# Patient Record
Sex: Female | Born: 1965 | Race: White | Hispanic: No | Marital: Married | Smoking: Former smoker
Health system: Southern US, Community
[De-identification: ages and names within clinical notes are randomized; demographics above are authoritative.]

## PROBLEM LIST (undated history)

## (undated) DIAGNOSIS — D219 Benign neoplasm of connective and other soft tissue, unspecified: Secondary | ICD-10-CM

## (undated) DIAGNOSIS — T753XXA Motion sickness, initial encounter: Secondary | ICD-10-CM

## (undated) DIAGNOSIS — T8859XA Other complications of anesthesia, initial encounter: Secondary | ICD-10-CM

## (undated) DIAGNOSIS — T4145XA Adverse effect of unspecified anesthetic, initial encounter: Secondary | ICD-10-CM

## (undated) DIAGNOSIS — K219 Gastro-esophageal reflux disease without esophagitis: Secondary | ICD-10-CM

## (undated) DIAGNOSIS — R51 Headache: Secondary | ICD-10-CM

## (undated) DIAGNOSIS — J45909 Unspecified asthma, uncomplicated: Secondary | ICD-10-CM

## (undated) DIAGNOSIS — Q283 Other malformations of cerebral vessels: Secondary | ICD-10-CM

## (undated) DIAGNOSIS — D249 Benign neoplasm of unspecified breast: Secondary | ICD-10-CM

## (undated) DIAGNOSIS — M810 Age-related osteoporosis without current pathological fracture: Secondary | ICD-10-CM

## (undated) DIAGNOSIS — R87619 Unspecified abnormal cytological findings in specimens from cervix uteri: Secondary | ICD-10-CM

## (undated) DIAGNOSIS — F419 Anxiety disorder, unspecified: Secondary | ICD-10-CM

## (undated) DIAGNOSIS — E039 Hypothyroidism, unspecified: Secondary | ICD-10-CM

## (undated) DIAGNOSIS — D649 Anemia, unspecified: Secondary | ICD-10-CM

## (undated) DIAGNOSIS — N809 Endometriosis, unspecified: Secondary | ICD-10-CM

## (undated) DIAGNOSIS — M1990 Unspecified osteoarthritis, unspecified site: Secondary | ICD-10-CM

## (undated) HISTORY — DX: Benign neoplasm of connective and other soft tissue, unspecified: D21.9

## (undated) HISTORY — DX: Anemia, unspecified: D64.9

## (undated) HISTORY — DX: Age-related osteoporosis without current pathological fracture: M81.0

## (undated) HISTORY — DX: Unspecified abnormal cytological findings in specimens from cervix uteri: R87.619

## (undated) HISTORY — DX: Anxiety disorder, unspecified: F41.9

## (undated) HISTORY — DX: Endometriosis, unspecified: N80.9

---

## 1988-02-19 HISTORY — PX: THYROID LOBECTOMY: SHX420

## 1989-02-18 HISTORY — PX: TUBAL LIGATION: SHX77

## 1990-02-18 DIAGNOSIS — R87619 Unspecified abnormal cytological findings in specimens from cervix uteri: Secondary | ICD-10-CM

## 1990-02-18 HISTORY — DX: Unspecified abnormal cytological findings in specimens from cervix uteri: R87.619

## 1992-02-19 HISTORY — PX: CHOLECYSTECTOMY: SHX55

## 2002-02-18 HISTORY — PX: OOPHORECTOMY: SHX86

## 2002-04-13 ENCOUNTER — Ambulatory Visit: Admission: RE | Admit: 2002-04-13 | Discharge: 2002-04-13 | Payer: Self-pay | Admitting: Gynecology

## 2002-04-16 ENCOUNTER — Encounter: Payer: Self-pay | Admitting: Gynecology

## 2002-04-20 ENCOUNTER — Inpatient Hospital Stay (HOSPITAL_COMMUNITY): Admission: RE | Admit: 2002-04-20 | Discharge: 2002-04-21 | Payer: Self-pay | Admitting: Gynecology

## 2002-04-20 ENCOUNTER — Encounter (INDEPENDENT_AMBULATORY_CARE_PROVIDER_SITE_OTHER): Payer: Self-pay

## 2002-04-20 HISTORY — PX: TOTAL ABDOMINAL HYSTERECTOMY: SHX209

## 2003-05-26 ENCOUNTER — Other Ambulatory Visit: Admission: RE | Admit: 2003-05-26 | Discharge: 2003-05-26 | Payer: Self-pay | Admitting: Gynecology

## 2003-06-13 ENCOUNTER — Encounter: Admission: RE | Admit: 2003-06-13 | Discharge: 2003-06-13 | Payer: Self-pay | Admitting: Gynecology

## 2003-07-12 ENCOUNTER — Encounter: Admission: RE | Admit: 2003-07-12 | Discharge: 2003-07-12 | Payer: Self-pay | Admitting: Gynecology

## 2004-05-24 ENCOUNTER — Other Ambulatory Visit: Admission: RE | Admit: 2004-05-24 | Discharge: 2004-05-24 | Payer: Self-pay | Admitting: Gynecology

## 2004-06-14 ENCOUNTER — Encounter: Admission: RE | Admit: 2004-06-14 | Discharge: 2004-06-14 | Payer: Self-pay | Admitting: Gynecology

## 2004-06-26 ENCOUNTER — Encounter: Admission: RE | Admit: 2004-06-26 | Discharge: 2004-06-26 | Payer: Self-pay | Admitting: Gynecology

## 2005-05-28 ENCOUNTER — Other Ambulatory Visit: Admission: RE | Admit: 2005-05-28 | Discharge: 2005-05-28 | Payer: Self-pay | Admitting: Gynecology

## 2005-06-17 ENCOUNTER — Encounter: Admission: RE | Admit: 2005-06-17 | Discharge: 2005-06-17 | Payer: Self-pay | Admitting: Gynecology

## 2006-06-24 ENCOUNTER — Encounter: Admission: RE | Admit: 2006-06-24 | Discharge: 2006-06-24 | Payer: Self-pay | Admitting: Gynecology

## 2006-09-19 ENCOUNTER — Encounter: Admission: RE | Admit: 2006-09-19 | Discharge: 2006-09-19 | Payer: Self-pay | Admitting: Surgery

## 2007-08-05 ENCOUNTER — Encounter: Admission: RE | Admit: 2007-08-05 | Discharge: 2007-08-05 | Payer: Self-pay | Admitting: Family Medicine

## 2008-03-17 ENCOUNTER — Other Ambulatory Visit: Admission: RE | Admit: 2008-03-17 | Discharge: 2008-03-17 | Payer: Self-pay | Admitting: Gynecology

## 2008-07-13 ENCOUNTER — Emergency Department (HOSPITAL_BASED_OUTPATIENT_CLINIC_OR_DEPARTMENT_OTHER): Admission: EM | Admit: 2008-07-13 | Discharge: 2008-07-13 | Payer: Self-pay | Admitting: Emergency Medicine

## 2008-08-29 ENCOUNTER — Encounter: Admission: RE | Admit: 2008-08-29 | Discharge: 2008-08-29 | Payer: Self-pay | Admitting: Gynecology

## 2009-02-18 HISTORY — PX: EVALUATION UNDER ANESTHESIA WITH ANAL FISTULECTOMY: SHX5621

## 2009-09-05 ENCOUNTER — Encounter: Admission: RE | Admit: 2009-09-05 | Discharge: 2009-09-05 | Payer: Self-pay | Admitting: Obstetrics and Gynecology

## 2009-09-08 ENCOUNTER — Encounter: Admission: RE | Admit: 2009-09-08 | Discharge: 2009-09-08 | Payer: Self-pay | Admitting: Obstetrics and Gynecology

## 2010-03-11 ENCOUNTER — Encounter: Payer: Self-pay | Admitting: Family Medicine

## 2010-07-06 NOTE — H&P (Signed)
[name]                           ACCOUNT NO.:  [id number]   MEDICAL RECORD NO.:  [id number]                   PATIENT TYPE:  INP   LOCATION:  X006                                 FACILITY:  [facility]   PHYSICIAN:  Leatha Gilding. Mezer, M.D.               DATE OF BIRTH:  [birth year]   DATE OF ADMISSION:  04/20/2002  DATE OF DISCHARGE:                                HISTORY & PHYSICAL   ADMITTING DIAGNOSES:  Pelvic mass.   HISTORY OF PRESENT ILLNESS:  The patient is a 45 year old gravida 1, para 1  female status post tubal ligation admitted with last menstrual period on  April 10, 2002 for total abdominal hysterectomy and bilateral salpingo-  oophorectomy.  The patient was seen by _______ with a question of a left  ovarian cyst.  The patient has had left lower quadrant pain for greater than  one month that has not changed in character.  It is worse when lying down.  She has had no change in her bowel pattern, but does have constipation and  diarrhea at times.  There is no dyspareunia.  She has had no fever, no  dysuria.  She has heavy bleeding for two to three days with her menstrual  period and moderate to severe dysmenorrhea.  On examination performed in the  office on March 30, 2002 the pelvic examination was uncertain with the  uterus to be approximately 10-12 weeks in size, question fibroids, question  adnexal masses therein.  Ultrasound examination was performed on April 05, 2002 at which time bilateral large complex ovarian masses were noted  with the left ovary being 51.4 x 50.5 and the right ovary being 47.6 x 30.0.  There was no free fluid.  A CA-125 was obtained that returned as 138.5 with  upper limit of normal being 30.2.  The need for exploratory laparotomy,  total abdominal hysterectomy, and bilateral salpingo-oophorectomy has been  reviewed with the patient.  The patient has seen De Blanch, M.D.  in consultation regarding this surgery.  If  ovarian cancer is noted at the  time of surgery, De Blanch, M.D. will become the primary surgeon  and perform the indicated procedures.  Potential complications including,  but not limited to, anesthesia, injury to the bowel, bladder, ureters,  possible fistula formation, possible blood loss with transfusion and its  sequelae, and possible infection have been reviewed with the patient  regarding the total abdominal hysterectomy and bilateral salpingo-  oophorectomy.  Postoperative restrictions and expectations have been  reviewed in detail.  Pain control has been discussed.  Permanent  sterilization has been stressed.  The patient will undergo a preoperative  bowel prep.  If these masses are not cancer the possibility of hormone  replacement therapy has been reviewed with the patient.  The patient  mentioned at the end of the visit and had not mentioned previously that she  has a diagnosis of mitral valve prolapse and has been advised to take  antibiotics.  Upon further questioning the history sounded somewhat  questionable and given the fact that there was no heart murmur heard on the  first visit or at the preoperative visit, the patient was asked to get a  copy of the echocardiogram report and the decision will be made about extent  of preoperative antibiotics at that time.   PAST SURGICAL HISTORY:  1. Cesarean section.  2. Thyroid.  3. Tubal ligation.  4. D&C.  5. Laser of the cervix.  6. Gallbladder.   PAST MEDICAL HISTORY:  1. Hypothyroid.  2. Question MVP.    MEDICATIONS:  1. Synthroid.  2. Vitamins.  3. Calcium.   ALLERGIES:  SULFA.   SOCIAL HISTORY:  Smokes three-quarters of pack of cigarettes per day (the  patient has been counseled as to the health risks regarding smoking and  patient accepts the responsibilities).  ETOH.  The patient is a Public librarian.  Married for one year.   FAMILY HISTORY:  Positive for cancer.  Mother with  renal cell.  Grandfather  with stomach.   PHYSICAL EXAMINATION:  HEENT:  Negative.  LUNGS:  Clear.  HEART:  Without audible murmurs.  BREASTS:  Without masses or discharge.  ABDOMEN:  Soft and nontender.  PELVIC:  Vagina and cervix normal.  The uterus appears to be enlarged with a  mass in the left adnexa.  The right feels more normal.  No nodularity is  appreciated.  RECTAL:  Negative and hemoccult-neg.  EXTREMITIES:  Negative.   IMPRESSION:  1. Pelvic mass.  2. Hypothyroid.  3. Cigarette abuse.  4. Question mitral valve prolapse.   PLAN:  Exploratory laparotomy with total abdominal hysterectomy and  bilateral salpingo-oophorectomy and De Blanch, M.D. to assume  responsibility if ovarian cancer is found.                                               Leatha Gilding. Mezer, M.D.    HCM/MEDQ  D:  04/20/2002  T:  04/20/2002  Job:  [id number]   cc:   De Blanch, M.D.   Katrina Stack

## 2010-07-06 NOTE — Op Note (Signed)
[name], [name]                           ACCOUNT NO.:  [id number]   MEDICAL RECORD NO.:  [id number]                   PATIENT TYPE:  INP   LOCATION:  0443                                 FACILITY:  [facility]   PHYSICIAN:  Leatha Gilding. Mezer, M.D.               DATE OF BIRTH:  Feb 06, 1966   DATE OF PROCEDURE:  04/20/2002  DATE OF DISCHARGE:                                 OPERATIVE REPORT   PREOPERATIVE DIAGNOSIS:  Pelvic mass.   POSTOPERATIVE DIAGNOSES:  1. Left endometrioma.  2. Pelvic adhesions.   OPERATION PERFORMED:  Exploratory laparotomy with total abdominal  hysterectomy and left uterolysis.   SURGEON:  Leatha Gilding. Mezer, M.D.   ASSISTANTS:  1. De Blanch, M.D.  2. Telford Nab, R.N.   ANESTHESIA:  General endotracheal.   PREPARATION:  Betadine.   DESCRIPTION OF PROCEDURE:  With the patient in the modified lithotomy  position, she was prepped and draped in the routine fashion.  A midline  incision was made from below the umbilicus to above the pubic symphysis, and  part of an old scar was removed.  The incision was carried down  through  subcutaneous tissue, and the fascia and peritoneum were opened without  difficulty.  The pelvic washings were obtained.  A brief exploration of her  upper abdomen was benign.  Exploration of the pelvis revealed the uterus to  be normal in size and contour.  The right ovary and fallopian tube were  normal.  The left ovary contained an approximately 5 cm cystic structure and  was densely adherent to the pelvic sidewall, the uterus, and sigmoid colon.  An effort was made to take some of the adhesions down from the bowel to the  ovary and in the process, the cyst was violated, and chocolate material came  forth.  The left round ligament was then divided by cautery, the pelvic  sidewall opened, the sidewall structures identified.  The ureter was  identified, and an effort was then made to completely free the ovarian mass  from the sidewall and the bowel.  This dissection required unroofing the  ureter to the point where the ureter crossed under the uterine artery and  vein.  The cystic structure was then removed laterally, and attention was  paid to separating the bowel from the ovary, and this was done in the  mesenteric fat to decrease the chance of an ovarian remnant syndrome.  This  created a significant amount of raw, open tissue.  There was no harm to the  bowel.  At this point, the right round ligament was divided with cautery,  the pelvic sidewall opened, the ureter identified, the infundibulopelvic  ligament isolated, clamped, cut, and free tied with a 2-0 Vicryl and then  suture ligated with 2-0 Vicryl.  The left infundibulopelvic ligament was  also isolated, clamped, cut, and free tied with 2-0 Vicryl and then suture  ligated with 2-0 Vicryl.  The anterior leaf of the broad ligament was opened  and the bladder taken down easily.  The uterine artery and vein bilaterally  were clamped, cut, and suture ligated with 2-0 Vicryl.  The cardinal  ligament was taken in several bites, clamped, cut, and suture ligated with 2-  0 Vicryl.  The vagina was cross-clamped from both sides and the specimen  excised with the cervix intact.  The angles were then closed with 0 Vicryl  sutures, and the remaining space between the angled sutures closed with  figure-of-eight sutures of 0 Vicryl.  Then meticulous attention was paid to  hemostasis, and the ureters were reinspected and found to be out of harm's  way.  The pelvis was irrigated with copious amounts of irrigating solution,  and hemostasis was noted to be completely intact.  Antiadhesion separating  film was then placed over the raw areas of the pelvis, which as mentioned  before, were quite extensive.  The omentum was then brought down, and the  abdomen was closed in layers using a mass closure of 0 PDS.  Upon entering  the peritoneal cavity, there was an  adhesion of omentum to the anterior  abdominal wall taken down by cautery and upon closing, a last piece of  separating film was placed between the omentum and the anterior abdominal  wall.  Hemostasis was assured in the subcutaneous tissue, and the skin was  closed with staples.  The estimated blood loss was approximately 200 mL.  The sponge, instrument, and needle counts were correct x 2.  The patient  tolerated the procedure well and was taken to recovery room in satisfactory  condition.                                               Leatha Gilding. Mezer, M.D.    HCM/MEDQ  D:  04/20/2002  T:  04/20/2002  Job:  [id number]   cc:   De Blanch, M.D.   Katrina Stack

## 2010-07-06 NOTE — Consult Note (Signed)
NAMESAMREEN, SELTZER                           ACCOUNT NO.:  [id number]   MEDICAL RECORD NO.:  [id number]                   PATIENT TYPE:  OUT   LOCATION:  GYN                                  FACILITY:  [facility]   PHYSICIAN:  De Blanch, M.D.         DATE OF BIRTH:  [birth year]   DATE OF CONSULTATION:  04/13/2002  DATE OF DISCHARGE:                                   CONSULTATION   A 45 year old white female seen in consultation at the request of Dimas Aguas C.  Mezer, M.D. regarding management of newly diagnosed pelvic mass.  The  patient recently moved to [place] from [place] and on routine  examination was found to have a pelvic mass.  Ultrasound evaluation has  shown an 8.4 x 6.8 ovary with cysts measuring 5.1 and another 6.9, another  2.9 cm.  They seem to be predominantly on the left ovary, although there is  a clear cyst on the right ovary as well.  The uterus is thought to be  normal.  In addition, the patient's CA-125 value was elevated 138 units/mL.   The patient's main symptom is that of some low grade abdominal pain and a  heavy feeling in the lower pelvis.  From a gynecologic point of view the  patient has no significant history, although she does have severe  dysmenorrhea and menorrhagia.   OBSTETRIC HISTORY:  Gravida 1.   PAST MEDICAL HISTORY:  Mitral valve prolapse which is asymptomatic, although  the patient has been advised to take antibiotics.   PAST SURGICAL HISTORY:  1. Partial thyroidectomy.  2. Laparoscopic cholecystectomy.  3. Cesarean section.   CURRENT MEDICATIONS:  1. Synthroid.  2. Calcium.  3. Vitamin E and C supplementation.  4. Tagamet p.r.n.  5. Claritin p.r.n.   ALLERGIES:  SULFA (rash).   FAMILY HISTORY:  There are several women in the family including the mother  with endometriosis.   REVIEW OF SYSTEMS:  Otherwise negative.   PHYSICAL EXAMINATION:  VITAL SIGNS:  Height 5 feet 3 inches, weight 161  pounds, blood pressure  120/88, pulse 78.  GENERAL:  The patient is a healthy white female in no acute distress.  HEENT:  Negative.  NECK:  Supple without thyromegaly.  LYMPH:  There is no supraclavicular, axillary, or inguinal adenopathy.  ABDOMEN:  Soft, nontender.  No mass, organomegaly, ascites, or hernias are  noted.  She has a midline incision which is well healed.  PELVIC:  EGBUS, vagina, bladder, urethra are normal.  Cervix is normal.  Uterus is anterior, normal shape, size, consistency.  On the left there is a  fullness and mass effect measuring approximately 7-8 cm in diameter.  This  is slightly tender.  Rectovaginal examination confirms.   IMPRESSION:  Complex pelvic mass with an elevated CA-4 in a 45 year old  woman.  I suspect this is an ovarian neoplasm but ovarian malignancy cannot  be ruled out.  Agree with  Leatha Gilding. Mezer, M.D. recommendation that patient  undergo exploratory laparotomy, intraoperative frozen section.   I had a lengthy discussion with the patient and her husband regarding our  surgical approach if this does turn out to be an ovarian malignancy  including pelvic and periaortic lymphadenectomy, omentectomy, peritoneal  biopsies, and possibility for postoperative chemotherapy.  We will  coordinate surgery with Dimas Aguas C. Mezer, M.D.  All their questions are  answered.                                               De Blanch, M.D.    DC/MEDQ  D:  04/14/2002  T:  04/14/2002  Job:  [id number]   cc:   Leatha Gilding. Mezer, M.D.  [address]  [place]  [place] [address]  Fax: [phone]   Telford Nab, R.N.

## 2010-07-10 ENCOUNTER — Other Ambulatory Visit: Payer: Self-pay | Admitting: Emergency Medicine

## 2010-07-10 DIAGNOSIS — N63 Unspecified lump in unspecified breast: Secondary | ICD-10-CM

## 2010-07-17 ENCOUNTER — Ambulatory Visit
Admission: RE | Admit: 2010-07-17 | Discharge: 2010-07-17 | Disposition: A | Discharge status: Home / Self Care | Payer: BC Managed Care – PPO | Source: Ambulatory Visit | Attending: Emergency Medicine | Admitting: Emergency Medicine

## 2010-07-17 ENCOUNTER — Other Ambulatory Visit: Payer: Self-pay | Admitting: Gynecology

## 2010-07-17 DIAGNOSIS — Z1231 Encounter for screening mammogram for malignant neoplasm of breast: Secondary | ICD-10-CM

## 2010-07-17 DIAGNOSIS — N63 Unspecified lump in unspecified breast: Secondary | ICD-10-CM

## 2010-09-17 ENCOUNTER — Ambulatory Visit: Payer: BC Managed Care – PPO

## 2010-09-20 ENCOUNTER — Ambulatory Visit
Admission: RE | Admit: 2010-09-20 | Discharge: 2010-09-20 | Disposition: A | Discharge status: Home / Self Care | Payer: BC Managed Care – PPO | Source: Ambulatory Visit | Attending: Gynecology | Admitting: Gynecology

## 2010-09-20 DIAGNOSIS — Z1231 Encounter for screening mammogram for malignant neoplasm of breast: Secondary | ICD-10-CM

## 2012-02-19 HISTORY — PX: BREAST SURGERY: SHX581

## 2012-04-08 ENCOUNTER — Other Ambulatory Visit: Payer: Self-pay | Admitting: Gynecology

## 2012-04-08 DIAGNOSIS — N6452 Nipple discharge: Secondary | ICD-10-CM

## 2012-04-17 ENCOUNTER — Other Ambulatory Visit: Payer: Self-pay | Admitting: Gynecology

## 2012-04-17 ENCOUNTER — Ambulatory Visit
Admission: RE | Admit: 2012-04-17 | Discharge: 2012-04-17 | Disposition: A | Discharge status: Home / Self Care | Payer: Commercial Managed Care - PPO | Source: Ambulatory Visit | Attending: Gynecology | Admitting: Gynecology

## 2012-04-17 DIAGNOSIS — N6452 Nipple discharge: Secondary | ICD-10-CM

## 2012-04-27 ENCOUNTER — Ambulatory Visit
Admission: RE | Admit: 2012-04-27 | Discharge: 2012-04-27 | Disposition: A | Discharge status: Home / Self Care | Payer: Commercial Managed Care - PPO | Source: Ambulatory Visit | Attending: Gynecology | Admitting: Gynecology

## 2012-04-27 ENCOUNTER — Other Ambulatory Visit: Payer: Self-pay | Admitting: Gynecology

## 2012-04-27 DIAGNOSIS — N6452 Nipple discharge: Secondary | ICD-10-CM

## 2012-05-01 ENCOUNTER — Encounter (INDEPENDENT_AMBULATORY_CARE_PROVIDER_SITE_OTHER): Payer: Self-pay | Admitting: Surgery

## 2012-05-01 ENCOUNTER — Ambulatory Visit (INDEPENDENT_AMBULATORY_CARE_PROVIDER_SITE_OTHER): Payer: Commercial Managed Care - PPO | Admitting: Surgery

## 2012-05-01 VITALS — BP 142/64 | HR 68 | Temp 98.4°F | Resp 16 | Ht 63.5 in | Wt 190.0 lb

## 2012-05-01 DIAGNOSIS — D249 Benign neoplasm of unspecified breast: Secondary | ICD-10-CM | POA: Insufficient documentation

## 2012-05-01 NOTE — Progress Notes (Signed)
Patient ID: [name], female   DOB: [birth year], 47 y.o.   MRN: [id number]  Chief Complaint  Patient presents with  . New Evaluation    eval lt br papilloma    HPI [name] is a 47 y.o. female.  Referred by Dr. Chevis Case for evaluation of left intraductal papilloma  HPI [name] status post total abdominal hysterectomy and bilateral salpingo-oophorectomy in 2004 who presents with recent left bloody nipple discharge.  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 The discharge was bloody only on a couple of locations and then changed to a yellowish spontaneous discharge.  She underwent mammogram on 04/17/12 which was unremarkable.  An ultrasound showed a dilated duct system in the subareolar region with echogenic material.She underwent an unsuccessful attempt at a ductogram. On 04/27/2012 another ductogram was scheduled.  However at this time she was having clear discharge from both breasts. She then underwent another left breast ultrasound which showed a 1 cm mass at 3:00 in the subareolar region.  Core biopsy was performed and a clip was placed.  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  She is now referred for surgical evaluation for excision.  History reviewed. No pertinent past medical history.  Past Surgical History  Procedure Laterality Date  . Cesarean section    . Abdominal hysterectomy    . Cholecystectomy    . Thyroid lobectomy      Family History  Problem Relation Age of Onset  . Cancer Mother     kidney  . Cancer Father     malanoma    Social History History  Substance Use Topics  . Smoking status: Former Smoker    Quit date: 11/18/2009  . Smokeless tobacco: Never Used  . Alcohol Use: No    Allergies  Allergen Reactions  . Sulfur Rash    All over the body    Current Outpatient Prescriptions  Medication Sig Dispense Refill  . buPROPion (WELLBUTRIN) 100 MG  tablet Take 100 mg by mouth 2 (two) times daily.      Marland Kitchen dicyclomine (BENTYL) 10 MG capsule Take 10 mg by mouth 4 (four) times daily -  before meals and at bedtime.      Marland Kitchen estradiol (CLIMARA - DOSED IN MG/24 HR) 0.1 mg/24hr Place 1 patch onto the skin once a week.      . levothyroxine (SYNTHROID, LEVOTHROID) 100 MCG tablet Take 100 mcg by mouth daily.      Marland Kitchen omeprazole (PRILOSEC) 20 MG capsule Take 20 mg by mouth daily.       No current facility-administered medications for this visit.    Review of Systems Review of Systems  Constitutional: Negative for fever, chills and unexpected weight change.  HENT: Negative for hearing loss, congestion, sore throat, trouble swallowing and voice change.   Eyes: Negative for visual disturbance.  Respiratory: Negative for cough and wheezing.   Cardiovascular: Negative for chest pain, palpitations and leg swelling.  Gastrointestinal: Negative for nausea, vomiting, abdominal pain, diarrhea, constipation, blood in stool, abdominal distention and anal bleeding.  Genitourinary: Negative for hematuria, vaginal bleeding and difficulty urinating.  Musculoskeletal: Negative for arthralgias.  Skin: Negative for rash and wound.  Neurological: Negative for seizures, syncope and headaches.  Hematological: Negative for adenopathy. Does not bruise/bleed easily.  Psychiatric/Behavioral: Negative for confusion.   Menarche age 21 First pregnancy age 19 No breastfeeding Surgical menopause - age 43 Hormones  9 years Family history negative   Blood pressure 142/64, pulse 68, temperature 98.4 F (36.9 C), temperature source Temporal, resp. rate 16, height 5' 3.5" (1.613 m), weight 190 lb (86.183 kg).  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  The surgical procedure has been discussed with the patient.  Potential risks, benefits, alternative treatments, and expected outcomes have been explained.  All of the patient's questions at this time have been answered.  The likelihood of reaching the patient's treatment goal is good.  The patient understand the proposed surgical procedure and wishes to proceed.         [name]. 05/01/2012, 3:38 PM

## 2012-05-18 ENCOUNTER — Ambulatory Visit (INDEPENDENT_AMBULATORY_CARE_PROVIDER_SITE_OTHER): Payer: Self-pay | Admitting: Surgery

## 2012-05-19 DIAGNOSIS — D249 Benign neoplasm of unspecified breast: Secondary | ICD-10-CM

## 2012-05-19 HISTORY — DX: Benign neoplasm of unspecified breast: D24.9

## 2012-05-21 ENCOUNTER — Encounter (HOSPITAL_BASED_OUTPATIENT_CLINIC_OR_DEPARTMENT_OTHER): Payer: Self-pay | Admitting: *Deleted

## 2012-05-21 NOTE — Pre-Procedure Instructions (Signed)
Neuro note and MRI report reviewed by Dr. Ivin Booty; pt. OK to come for surgery.

## 2012-05-26 ENCOUNTER — Telehealth (INDEPENDENT_AMBULATORY_CARE_PROVIDER_SITE_OTHER): Payer: Self-pay | Admitting: General Surgery

## 2012-05-26 NOTE — Telephone Encounter (Signed)
Called patient to let her know that Dr Corliss Skains stated that she is having a lumpectomy to make sure that there is no cancer. If she has a cancer then we will talk about genetic testing later. Patient was ok with the answer

## 2012-05-26 NOTE — Telephone Encounter (Signed)
Message copied by Wilder Glade on Tue May 26, 2012 11:56 AM ------      Message from: Wynona Luna      Created: Tue May 26, 2012 11:27 AM       We don't even know if she has cancer.  The biopsy showed a benign intraductal papilloma.  We are doing the lumpectomy to make sure that there is no cancer.  If she has a cancer, we can talk about genetic testing later.            ----- Message -----         From: Wilder Glade, MA         Sent: 05/26/2012  11:20 AM           To: Wilmon Arms. Corliss Skains, MD            Hey Mrs Frate walked in today and wanted to know if she can be schedule for genetic testing she is schedule for surgery tomorrow 05-27-12 for a left breast lump. I told patient that I will talk to you and I will call her about once you give me a answer. Thanks Pattricia Boss       ------

## 2012-05-27 ENCOUNTER — Ambulatory Visit
Admission: RE | Admit: 2012-05-27 | Discharge: 2012-05-27 | Disposition: A | Discharge status: Home / Self Care | Payer: Commercial Managed Care - PPO | Source: Ambulatory Visit | Attending: Surgery | Admitting: Surgery

## 2012-05-27 ENCOUNTER — Ambulatory Visit (HOSPITAL_BASED_OUTPATIENT_CLINIC_OR_DEPARTMENT_OTHER)
Admission: RE | Admit: 2012-05-27 | Discharge: 2012-05-27 | Disposition: A | Discharge status: Home / Self Care | Payer: Commercial Managed Care - PPO | Source: Ambulatory Visit | Attending: Surgery | Admitting: Surgery

## 2012-05-27 ENCOUNTER — Ambulatory Visit (HOSPITAL_BASED_OUTPATIENT_CLINIC_OR_DEPARTMENT_OTHER): Payer: Commercial Managed Care - PPO | Admitting: *Deleted

## 2012-05-27 ENCOUNTER — Encounter (HOSPITAL_BASED_OUTPATIENT_CLINIC_OR_DEPARTMENT_OTHER)
Admission: RE | Disposition: A | Discharge status: Home / Self Care | Payer: Self-pay | Source: Ambulatory Visit | Attending: Surgery

## 2012-05-27 ENCOUNTER — Encounter (HOSPITAL_BASED_OUTPATIENT_CLINIC_OR_DEPARTMENT_OTHER): Payer: Self-pay | Admitting: *Deleted

## 2012-05-27 DIAGNOSIS — D249 Benign neoplasm of unspecified breast: Secondary | ICD-10-CM

## 2012-05-27 DIAGNOSIS — Z808 Family history of malignant neoplasm of other organs or systems: Secondary | ICD-10-CM | POA: Insufficient documentation

## 2012-05-27 DIAGNOSIS — D242 Benign neoplasm of left breast: Secondary | ICD-10-CM

## 2012-05-27 DIAGNOSIS — K219 Gastro-esophageal reflux disease without esophagitis: Secondary | ICD-10-CM | POA: Insufficient documentation

## 2012-05-27 DIAGNOSIS — Z90710 Acquired absence of both cervix and uterus: Secondary | ICD-10-CM | POA: Insufficient documentation

## 2012-05-27 DIAGNOSIS — Z79899 Other long term (current) drug therapy: Secondary | ICD-10-CM | POA: Insufficient documentation

## 2012-05-27 DIAGNOSIS — Z87891 Personal history of nicotine dependence: Secondary | ICD-10-CM | POA: Insufficient documentation

## 2012-05-27 DIAGNOSIS — E039 Hypothyroidism, unspecified: Secondary | ICD-10-CM | POA: Insufficient documentation

## 2012-05-27 DIAGNOSIS — N6089 Other benign mammary dysplasias of unspecified breast: Secondary | ICD-10-CM | POA: Insufficient documentation

## 2012-05-27 DIAGNOSIS — Z780 Asymptomatic menopausal state: Secondary | ICD-10-CM | POA: Insufficient documentation

## 2012-05-27 DIAGNOSIS — Z8051 Family history of malignant neoplasm of kidney: Secondary | ICD-10-CM | POA: Insufficient documentation

## 2012-05-27 DIAGNOSIS — Z6834 Body mass index (BMI) 34.0-34.9, adult: Secondary | ICD-10-CM | POA: Insufficient documentation

## 2012-05-27 DIAGNOSIS — Z9079 Acquired absence of other genital organ(s): Secondary | ICD-10-CM | POA: Insufficient documentation

## 2012-05-27 DIAGNOSIS — Z882 Allergy status to sulfonamides status: Secondary | ICD-10-CM | POA: Insufficient documentation

## 2012-05-27 HISTORY — PX: BREAST LUMPECTOMY WITH NEEDLE LOCALIZATION: SHX5759

## 2012-05-27 HISTORY — DX: Benign neoplasm of unspecified breast: D24.9

## 2012-05-27 HISTORY — DX: Adverse effect of unspecified anesthetic, initial encounter: T41.45XA

## 2012-05-27 HISTORY — DX: Other malformations of cerebral vessels: Q28.3

## 2012-05-27 HISTORY — DX: Gastro-esophageal reflux disease without esophagitis: K21.9

## 2012-05-27 HISTORY — DX: Headache: R51

## 2012-05-27 HISTORY — DX: Unspecified osteoarthritis, unspecified site: M19.90

## 2012-05-27 HISTORY — DX: Motion sickness, initial encounter: T75.3XXA

## 2012-05-27 HISTORY — DX: Hypothyroidism, unspecified: E03.9

## 2012-05-27 HISTORY — DX: Other complications of anesthesia, initial encounter: T88.59XA

## 2012-05-27 SURGERY — BREAST LUMPECTOMY WITH NEEDLE LOCALIZATION
Anesthesia: General | Site: Breast | Laterality: Left | Wound class: Clean

## 2012-05-27 MED ORDER — LACTATED RINGERS IV SOLN
INTRAVENOUS | Status: DC
  Administered 2012-05-27 (×2): via INTRAVENOUS

## 2012-05-27 MED ORDER — FENTANYL CITRATE 0.05 MG/ML IJ SOLN: 50.0000 ug | INTRAMUSCULAR | Status: DC | PRN

## 2012-05-27 MED ORDER — HYDROCODONE-ACETAMINOPHEN 5-325 MG PO TABS: 1.0000 | ORAL_TABLET | ORAL | Status: DC | PRN

## 2012-05-27 MED ORDER — FENTANYL CITRATE 0.05 MG/ML IJ SOLN
INTRAMUSCULAR | Status: DC | PRN
  Administered 2012-05-27 (×2): 50 ug via INTRAVENOUS

## 2012-05-27 MED ORDER — MORPHINE SULFATE 2 MG/ML IJ SOLN: 2.0000 mg | INTRAMUSCULAR | Status: DC | PRN

## 2012-05-27 MED ORDER — ACETAMINOPHEN 10 MG/ML IV SOLN
1000.0000 mg | Freq: Once | INTRAVENOUS | Status: AC
  Administered 2012-05-27: 1000 mg via INTRAVENOUS

## 2012-05-27 MED ORDER — MIDAZOLAM HCL 2 MG/ML PO SYRP: 12.0000 mg | ORAL_SOLUTION | Freq: Once | ORAL | Status: DC | PRN

## 2012-05-27 MED ORDER — PROPOFOL 10 MG/ML IV BOLUS
INTRAVENOUS | Status: DC | PRN
  Administered 2012-05-27: 200 mg via INTRAVENOUS

## 2012-05-27 MED ORDER — BUPIVACAINE-EPINEPHRINE 0.25% -1:200000 IJ SOLN
INTRAMUSCULAR | Status: DC | PRN
  Administered 2012-05-27: 10 mL

## 2012-05-27 MED ORDER — CHLORHEXIDINE GLUCONATE 4 % EX LIQD: 1.0000 "application " | Freq: Once | CUTANEOUS | Status: DC

## 2012-05-27 MED ORDER — DEXAMETHASONE SODIUM PHOSPHATE 4 MG/ML IJ SOLN
INTRAMUSCULAR | Status: DC | PRN
  Administered 2012-05-27: 4 mg via INTRAVENOUS

## 2012-05-27 MED ORDER — HYDROCODONE-ACETAMINOPHEN 5-325 MG PO TABS
1.0000 | ORAL_TABLET | ORAL | Status: DC | PRN
  Administered 2012-05-27: 1 via ORAL

## 2012-05-27 MED ORDER — LIDOCAINE HCL (CARDIAC) 20 MG/ML IV SOLN
INTRAVENOUS | Status: DC | PRN
  Administered 2012-05-27: 40 mg via INTRAVENOUS

## 2012-05-27 MED ORDER — MIDAZOLAM HCL 5 MG/5ML IJ SOLN
INTRAMUSCULAR | Status: DC | PRN
  Administered 2012-05-27: 1 mg via INTRAVENOUS

## 2012-05-27 MED ORDER — CEFAZOLIN SODIUM-DEXTROSE 2-3 GM-% IV SOLR
2.0000 g | INTRAVENOUS | Status: AC
  Administered 2012-05-27: 2 g via INTRAVENOUS

## 2012-05-27 MED ORDER — ONDANSETRON HCL 4 MG/2ML IJ SOLN: 4.0000 mg | INTRAMUSCULAR | Status: DC | PRN

## 2012-05-27 MED ORDER — MIDAZOLAM HCL 2 MG/2ML IJ SOLN: 1.0000 mg | INTRAMUSCULAR | Status: DC | PRN

## 2012-05-27 SURGICAL SUPPLY — 46 items
APL SKNCLS STERI-STRIP NONHPOA (GAUZE/BANDAGES/DRESSINGS) ×1
APPLICATOR COTTON TIP 6IN STRL (MISCELLANEOUS) IMPLANT
BENZOIN TINCTURE PRP APPL 2/3 (GAUZE/BANDAGES/DRESSINGS) ×2 IMPLANT
BLADE HEX COATED 2.75 (ELECTRODE) ×2 IMPLANT
BLADE SURG 15 STRL LF DISP TIS (BLADE) ×1 IMPLANT
BLADE SURG 15 STRL SS (BLADE) ×2
CANISTER SUCTION 1200CC (MISCELLANEOUS) IMPLANT
CHLORAPREP W/TINT 26ML (MISCELLANEOUS) ×2 IMPLANT
CLOTH BEACON ORANGE TIMEOUT ST (SAFETY) ×2 IMPLANT
COVER MAYO STAND STRL (DRAPES) ×2 IMPLANT
COVER TABLE BACK 60X90 (DRAPES) ×2 IMPLANT
DECANTER SPIKE VIAL GLASS SM (MISCELLANEOUS) ×1 IMPLANT
DEVICE DUBIN W/COMP PLATE 8390 (MISCELLANEOUS) ×1 IMPLANT
DRAPE PED LAPAROTOMY (DRAPES) ×2 IMPLANT
DRAPE UTILITY XL STRL (DRAPES) ×2 IMPLANT
DRSG TEGADERM 4X4.75 (GAUZE/BANDAGES/DRESSINGS) ×3 IMPLANT
ELECT REM PT RETURN 9FT ADLT (ELECTROSURGICAL) ×2
ELECTRODE REM PT RTRN 9FT ADLT (ELECTROSURGICAL) ×1 IMPLANT
GAUZE SPONGE 4X4 12PLY STRL LF (GAUZE/BANDAGES/DRESSINGS) ×1 IMPLANT
GLOVE BIO SURGEON STRL SZ7 (GLOVE) ×2 IMPLANT
GLOVE BIOGEL PI IND STRL 7.5 (GLOVE) ×1 IMPLANT
GLOVE BIOGEL PI INDICATOR 7.5 (GLOVE) ×1
GLOVE EXAM NITRILE MD LF STRL (GLOVE) ×1 IMPLANT
GLOVE SKINSENSE NS SZ7.0 (GLOVE) ×1
GLOVE SKINSENSE STRL SZ7.0 (GLOVE) IMPLANT
GOWN PREVENTION PLUS XLARGE (GOWN DISPOSABLE) ×3 IMPLANT
KIT MARKER MARGIN INK (KITS) ×1 IMPLANT
NDL HYPO 25X1 1.5 SAFETY (NEEDLE) ×1 IMPLANT
NEEDLE HYPO 25X1 1.5 SAFETY (NEEDLE) ×2 IMPLANT
NS IRRIG 1000ML POUR BTL (IV SOLUTION) IMPLANT
PACK BASIN DAY SURGERY FS (CUSTOM PROCEDURE TRAY) ×2 IMPLANT
PENCIL BUTTON HOLSTER BLD 10FT (ELECTRODE) ×2 IMPLANT
SLEEVE SCD COMPRESS KNEE MED (MISCELLANEOUS) ×2 IMPLANT
SPONGE GAUZE 2X2 8PLY STRL LF (GAUZE/BANDAGES/DRESSINGS) ×1 IMPLANT
SPONGE LAP 4X18 X RAY DECT (DISPOSABLE) ×2 IMPLANT
STRIP CLOSURE SKIN 1/2X4 (GAUZE/BANDAGES/DRESSINGS) ×2 IMPLANT
SUT CHROMIC 3 0 SH 27 (SUTURE) IMPLANT
SUT MON AB 4-0 PC3 18 (SUTURE) ×2 IMPLANT
SUT SILK 2 0 SH (SUTURE) IMPLANT
SUT VIC AB 3-0 SH 27 (SUTURE) ×2
SUT VIC AB 3-0 SH 27X BRD (SUTURE) ×1 IMPLANT
SYR CONTROL 10ML LL (SYRINGE) ×2 IMPLANT
TOWEL OR 17X24 6PK STRL BLUE (TOWEL DISPOSABLE) ×2 IMPLANT
TOWEL OR NON WOVEN STRL DISP B (DISPOSABLE) ×2 IMPLANT
TUBE CONNECTING 20X1/4 (TUBING) IMPLANT
YANKAUER SUCT BULB TIP NO VENT (SUCTIONS) IMPLANT

## 2012-05-27 NOTE — Interval H&P Note (Signed)
History and Physical Interval Note:  05/27/2012 10:12 AM  [name]  has presented today for surgery, with the diagnosis of left intraductal pailloma   The various methods of treatment have been discussed with the patient and family. After consideration of risks, benefits and other options for treatment, the patient has consented to  Procedure(s) with comments: BREAST LUMPECTOMY WITH NEEDLE LOCALIZATION (Left) - needle localization at breast center of [place]  as a surgical intervention .  The patient's history has been reviewed, patient examined, no change in status, stable for surgery.  I have reviewed the patient's chart and labs.  Questions were answered to the patient's satisfaction.     [name] K.

## 2012-05-27 NOTE — Transfer of Care (Signed)
Immediate Anesthesia Transfer of Care Note  Patient: [name]  Procedure(s) Performed: Procedure(s) with comments: BREAST LUMPECTOMY WITH NEEDLE LOCALIZATION (Left) - needle localization at breast center of [place]   Patient Location: PACU  Anesthesia Type:General  Level of Consciousness: awake, alert  and oriented  Airway & Oxygen Therapy: Patient Spontanous Breathing and Patient connected to face mask oxygen  Post-op Assessment: Report given to PACU RN, Post -op Vital signs reviewed and stable and Patient moving all extremities  Post vital signs: Reviewed and stable  Complications: No apparent anesthesia complications

## 2012-05-27 NOTE — H&P (View-Only) (Signed)
Patient ID: [name], female   DOB: 12/03/1965, 47 y.o.   MRN: [id number]  Chief Complaint  Patient presents with  . New Evaluation    eval lt br papilloma    HPI [name] is a 47 y.o. female.  Referred by Dr. Chevis Pretty for evaluation of left intraductal papilloma  HPI [name] status post total abdominal hysterectomy and bilateral salpingo-oophorectomy in 2004 who presents with recent left bloody nipple discharge.  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 The discharge was bloody only on a couple of locations and then changed to a yellowish spontaneous discharge.  She underwent mammogram on 04/17/12 which was unremarkable.  An ultrasound showed a dilated duct system in the subareolar region with echogenic material.She underwent an unsuccessful attempt at a ductogram. On 04/27/2012 another ductogram was scheduled.  However at this time she was having clear discharge from both breasts. She then underwent another left breast ultrasound which showed a 1 cm mass at 3:00 in the subareolar region.  Core biopsy was performed and a clip was placed.  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  She is now referred for surgical evaluation for excision.  History reviewed. No pertinent past medical history.  Past Surgical History  Procedure Laterality Date  . Cesarean section    . Abdominal hysterectomy    . Cholecystectomy    . Thyroid lobectomy      Family History  Problem Relation Age of Onset  . Cancer Mother     kidney  . Cancer Father     malanoma    Social History History  Substance Use Topics  . Smoking status: Former Smoker    Quit date: 11/18/2009  . Smokeless tobacco: Never Used  . Alcohol Use: No    Allergies  Allergen Reactions  . Sulfur Rash    All over the body    Current Outpatient Prescriptions  Medication Sig Dispense Refill  . buPROPion (WELLBUTRIN) 100 MG  tablet Take 100 mg by mouth 2 (two) times daily.      Marland Kitchen dicyclomine (BENTYL) 10 MG capsule Take 10 mg by mouth 4 (four) times daily -  before meals and at bedtime.      Marland Kitchen estradiol (CLIMARA - DOSED IN MG/24 HR) 0.1 mg/24hr Place 1 patch onto the skin once a week.      . levothyroxine (SYNTHROID, LEVOTHROID) 100 MCG tablet Take 100 mcg by mouth daily.      Marland Kitchen omeprazole (PRILOSEC) 20 MG capsule Take 20 mg by mouth daily.       No current facility-administered medications for this visit.    Review of Systems Review of Systems  Constitutional: Negative for fever, chills and unexpected weight change.  HENT: Negative for hearing loss, congestion, sore throat, trouble swallowing and voice change.   Eyes: Negative for visual disturbance.  Respiratory: Negative for cough and wheezing.   Cardiovascular: Negative for chest pain, palpitations and leg swelling.  Gastrointestinal: Negative for nausea, vomiting, abdominal pain, diarrhea, constipation, blood in stool, abdominal distention and anal bleeding.  Genitourinary: Negative for hematuria, vaginal bleeding and difficulty urinating.  Musculoskeletal: Negative for arthralgias.  Skin: Negative for rash and wound.  Neurological: Negative for seizures, syncope and headaches.  Hematological: Negative for adenopathy. Does not bruise/bleed easily.  Psychiatric/Behavioral: Negative for confusion.   Menarche age 50 First pregnancy age 43 No breastfeeding Surgical menopause - age 28 Hormones  9 years Family history negative   Blood pressure 142/64, pulse 68, temperature 98.4 F (36.9 C), temperature source Temporal, resp. rate 16, height 5' 3.5" (1.613 m), weight 190 lb (86.183 kg).  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  The surgical procedure has been discussed with the patient.  Potential risks, benefits, alternative treatments, and expected outcomes have been explained.  All of the patient's questions at this time have been answered.  The likelihood of reaching the patient's treatment goal is good.  The patient understand the proposed surgical procedure and wishes to proceed.         TSUEI,MATTHEW K. 05/01/2012, 3:38 PM

## 2012-05-27 NOTE — Anesthesia Postprocedure Evaluation (Signed)
   Anesthesia Post-op Note  Patient: [name]  Procedure(s) Performed: Procedure(s) with comments: BREAST LUMPECTOMY WITH NEEDLE LOCALIZATION (Left) - needle localization at breast center of [place]   Patient Location: PACU  Anesthesia Type:General  Level of Consciousness: awake, alert , oriented and patient cooperative  Airway and Oxygen Therapy: Patient Spontanous Breathing  Post-op Pain: none  Post-op Assessment: Post-op Vital signs reviewed, Patient's Cardiovascular Status Stable, Respiratory Function Stable, Patent Airway, No signs of Nausea or vomiting and Pain level controlled  Post-op Vital Signs: Reviewed and stable  Complications: No apparent anesthesia complications

## 2012-05-27 NOTE — Op Note (Signed)
Preop diagnosis:Left intraductal papilloma Postop diagnosis: Same Procedure: Left needle localized lumpectomy Surgeon:Pleasant Bensinger K. Anesthesia: Gen. Via LMA Indications: [name] who presents with a recent workup showing a mass in the left breast behind the areola. Core biopsy showed Intraductal papilloma with usual ductal hyperplasia. She now presents for complete excision. A wire was placed this morning at the breast center.  Description of procedure: The patient is brought to the operating room and placed in a supine position on the operating room table. After an adequate level of general anesthesia was obtained her left breast was prepped with chlor prep and draped in sterile fashion. We infiltrated the area around the wire in the nipple with quarter percent Marcaine with epinephrine. I made a circumareolar incision in the lateral part of the nipple. Dissection was carried down to the subcutaneous tissues with cautery. We delivered the wire from its insertion site laterally up through the middle of the wound. I excised a cylinder of tissue extending down about 5.5 cm.  The specimen was oriented with a paint kit. A specimen mammogram showed that the biopsy clip was within the center of the specimen. We irrigated and inspected for hemostasis. The wound was closed with a deep layer of 3-0 Vicryl and a subcuticular layer of 4-0 Monocryl. Steri-Strips and clean dressings were applied. The patient is then extubated and brought to the recovery room in stable condition. All sponge, initially, and needle counts are correct.  Wilmon Arms. Corliss Skains, MD, [facility]  05/27/2012 11:33 AM

## 2012-05-27 NOTE — Anesthesia Preprocedure Evaluation (Addendum)
Anesthesia Evaluation  Patient identified by MRN, date of birth, ID band Patient awake    Reviewed: Allergy & Precautions, H&P , NPO status , Patient's Chart, lab work & pertinent test results  History of Anesthesia Complications Negative for: history of anesthetic complications  Airway Mallampati: I TM Distance: >3 FB Neck ROM: Full    Dental  (+) Teeth Intact and Dental Advisory Given   Pulmonary former smoker (quit 3 years),  breath sounds clear to auscultation  Pulmonary exam normal       Cardiovascular negative cardio ROS  Rhythm:Regular Rate:Normal     Neuro/Psych  Headaches,    GI/Hepatic Neg liver ROS, GERD-  Medicated and Controlled,  Endo/Other  Hypothyroidism Morbid obesity  Renal/GU negative Renal ROS     Musculoskeletal   Abdominal (+) + obese,   Peds  Hematology   Anesthesia Other Findings   Reproductive/Obstetrics                           Anesthesia Physical Anesthesia Plan  ASA: II  Anesthesia Plan: General   Post-op Pain Management:    Induction: Intravenous  Airway Management Planned: LMA  Additional Equipment:   Intra-op Plan:   Post-operative Plan:   Informed Consent: I have reviewed the patients History and Physical, chart, labs and discussed the procedure including the risks, benefits and alternatives for the proposed anesthesia with the patient or authorized representative who has indicated his/her understanding and acceptance.   Dental advisory given  Plan Discussed with: Surgeon and CRNA  Anesthesia Plan Comments: (Plan routine monitors, GA- LMA OK)        Anesthesia Quick Evaluation

## 2012-05-27 NOTE — Anesthesia Procedure Notes (Signed)
Procedure Name: LMA Insertion Date/Time: 05/27/2012 10:40 AM Performed by: Meyer Russel Pre-anesthesia Checklist: Patient identified, Emergency Drugs available, Suction available and Patient being monitored Patient Re-evaluated:Patient Re-evaluated prior to inductionOxygen Delivery Method: Circle System Utilized Preoxygenation: Pre-oxygenation with 100% oxygen Intubation Type: IV induction Ventilation: Mask ventilation without difficulty LMA: LMA inserted LMA Size: 4.0 Number of attempts: 1 Airway Equipment and Method: bite block Placement Confirmation: positive ETCO2 and breath sounds checked- equal and bilateral Tube secured with: Tape Dental Injury: Teeth and Oropharynx as per pre-operative assessment

## 2012-05-28 ENCOUNTER — Encounter (HOSPITAL_BASED_OUTPATIENT_CLINIC_OR_DEPARTMENT_OTHER): Payer: Self-pay | Admitting: Surgery

## 2012-05-29 ENCOUNTER — Encounter (INDEPENDENT_AMBULATORY_CARE_PROVIDER_SITE_OTHER): Payer: Self-pay | Admitting: General Surgery

## 2012-06-15 ENCOUNTER — Encounter (INDEPENDENT_AMBULATORY_CARE_PROVIDER_SITE_OTHER): Payer: Self-pay | Admitting: Surgery

## 2012-06-15 ENCOUNTER — Ambulatory Visit (INDEPENDENT_AMBULATORY_CARE_PROVIDER_SITE_OTHER): Payer: Commercial Managed Care - PPO | Admitting: Surgery

## 2012-06-15 VITALS — BP 126/77 | HR 68 | Temp 98.3°F | Resp 14 | Ht 63.0 in | Wt 188.0 lb

## 2012-06-15 DIAGNOSIS — D249 Benign neoplasm of unspecified breast: Secondary | ICD-10-CM

## 2012-06-15 DIAGNOSIS — D242 Benign neoplasm of left breast: Secondary | ICD-10-CM

## 2012-06-15 NOTE — Progress Notes (Signed)
Status post left needle localized lumpectomy on 05/27/12. The pathology showed a benign intraductal papilloma. The patient had minimal pain. Her incision is healing well with no sign of infection. Occasionally she does have a little bit of drainage through the nipple but this is not unexpected. Likely she has a seroma just behind the nipple that is draining through the openings in the nipple itself. There is no sign of infection. She may resume annual mammograms and monthly self-examinations. Followup as needed.  Wilmon Arms. Corliss Skains, MD, [facility]  06/15/2012 3:55 PM

## 2013-02-26 ENCOUNTER — Other Ambulatory Visit (HOSPITAL_BASED_OUTPATIENT_CLINIC_OR_DEPARTMENT_OTHER): Payer: Self-pay | Admitting: Physician Assistant

## 2013-02-26 ENCOUNTER — Ambulatory Visit (HOSPITAL_BASED_OUTPATIENT_CLINIC_OR_DEPARTMENT_OTHER)
Admission: RE | Admit: 2013-02-26 | Discharge: 2013-02-26 | Disposition: A | Discharge status: Home / Self Care | Payer: Managed Care, Other (non HMO) | Source: Ambulatory Visit | Attending: Physician Assistant | Admitting: Physician Assistant

## 2013-02-26 DIAGNOSIS — R0602 Shortness of breath: Secondary | ICD-10-CM | POA: Insufficient documentation

## 2013-02-26 DIAGNOSIS — R0789 Other chest pain: Secondary | ICD-10-CM

## 2013-02-26 DIAGNOSIS — Z87891 Personal history of nicotine dependence: Secondary | ICD-10-CM | POA: Insufficient documentation

## 2013-02-26 DIAGNOSIS — R059 Cough, unspecified: Secondary | ICD-10-CM | POA: Insufficient documentation

## 2013-02-26 DIAGNOSIS — R05 Cough: Secondary | ICD-10-CM | POA: Insufficient documentation

## 2013-06-15 ENCOUNTER — Other Ambulatory Visit: Payer: Self-pay

## 2013-06-15 DIAGNOSIS — Z1231 Encounter for screening mammogram for malignant neoplasm of breast: Secondary | ICD-10-CM

## 2013-06-17 ENCOUNTER — Other Ambulatory Visit (HOSPITAL_BASED_OUTPATIENT_CLINIC_OR_DEPARTMENT_OTHER): Payer: Self-pay | Admitting: Physician Assistant

## 2013-06-17 ENCOUNTER — Ambulatory Visit (HOSPITAL_BASED_OUTPATIENT_CLINIC_OR_DEPARTMENT_OTHER)
Admission: RE | Admit: 2013-06-17 | Discharge: 2013-06-17 | Disposition: A | Discharge status: Home / Self Care | Payer: Managed Care, Other (non HMO) | Source: Ambulatory Visit | Attending: Physician Assistant | Admitting: Physician Assistant

## 2013-06-17 DIAGNOSIS — R05 Cough: Secondary | ICD-10-CM

## 2013-06-17 DIAGNOSIS — R059 Cough, unspecified: Secondary | ICD-10-CM

## 2013-06-21 ENCOUNTER — Ambulatory Visit
Admission: RE | Admit: 2013-06-21 | Discharge: 2013-06-21 | Disposition: A | Discharge status: Home / Self Care | Payer: Managed Care, Other (non HMO) | Source: Ambulatory Visit

## 2013-06-21 ENCOUNTER — Encounter (INDEPENDENT_AMBULATORY_CARE_PROVIDER_SITE_OTHER): Payer: Self-pay

## 2013-06-21 DIAGNOSIS — Z1231 Encounter for screening mammogram for malignant neoplasm of breast: Secondary | ICD-10-CM

## 2014-07-04 ENCOUNTER — Other Ambulatory Visit: Payer: Self-pay

## 2014-07-04 DIAGNOSIS — Z1231 Encounter for screening mammogram for malignant neoplasm of breast: Secondary | ICD-10-CM

## 2014-07-11 ENCOUNTER — Ambulatory Visit: Payer: Managed Care, Other (non HMO)

## 2014-07-13 ENCOUNTER — Ambulatory Visit
Admission: RE | Admit: 2014-07-13 | Discharge: 2014-07-13 | Disposition: A | Discharge status: Home / Self Care | Payer: Commercial Managed Care - PPO | Source: Ambulatory Visit

## 2014-07-13 DIAGNOSIS — Z1231 Encounter for screening mammogram for malignant neoplasm of breast: Secondary | ICD-10-CM

## 2014-07-19 ENCOUNTER — Other Ambulatory Visit: Payer: Self-pay

## 2014-07-20 LAB — CYTOLOGY - PAP

## 2014-10-29 DIAGNOSIS — J4540 Moderate persistent asthma, uncomplicated: Secondary | ICD-10-CM | POA: Insufficient documentation

## 2014-10-29 DIAGNOSIS — J310 Chronic rhinitis: Secondary | ICD-10-CM | POA: Insufficient documentation

## 2014-10-29 DIAGNOSIS — K219 Gastro-esophageal reflux disease without esophagitis: Secondary | ICD-10-CM | POA: Insufficient documentation

## 2015-01-17 ENCOUNTER — Other Ambulatory Visit: Payer: Self-pay

## 2015-01-17 MED ORDER — MOMETASONE FUROATE 50 MCG/ACT NA SUSP: 1.0000 | Freq: Every day | NASAL | Status: DC

## 2015-01-22 IMAGING — CR DG CHEST 2V
2 series · 2 of 2 positions shown · non-contrast
Comparison: None

CLINICAL DATA: Cough for 2 months, recent onset of shortness of
breath, former smoker

EXAM:
CHEST  2 VIEW

[w chest pa]
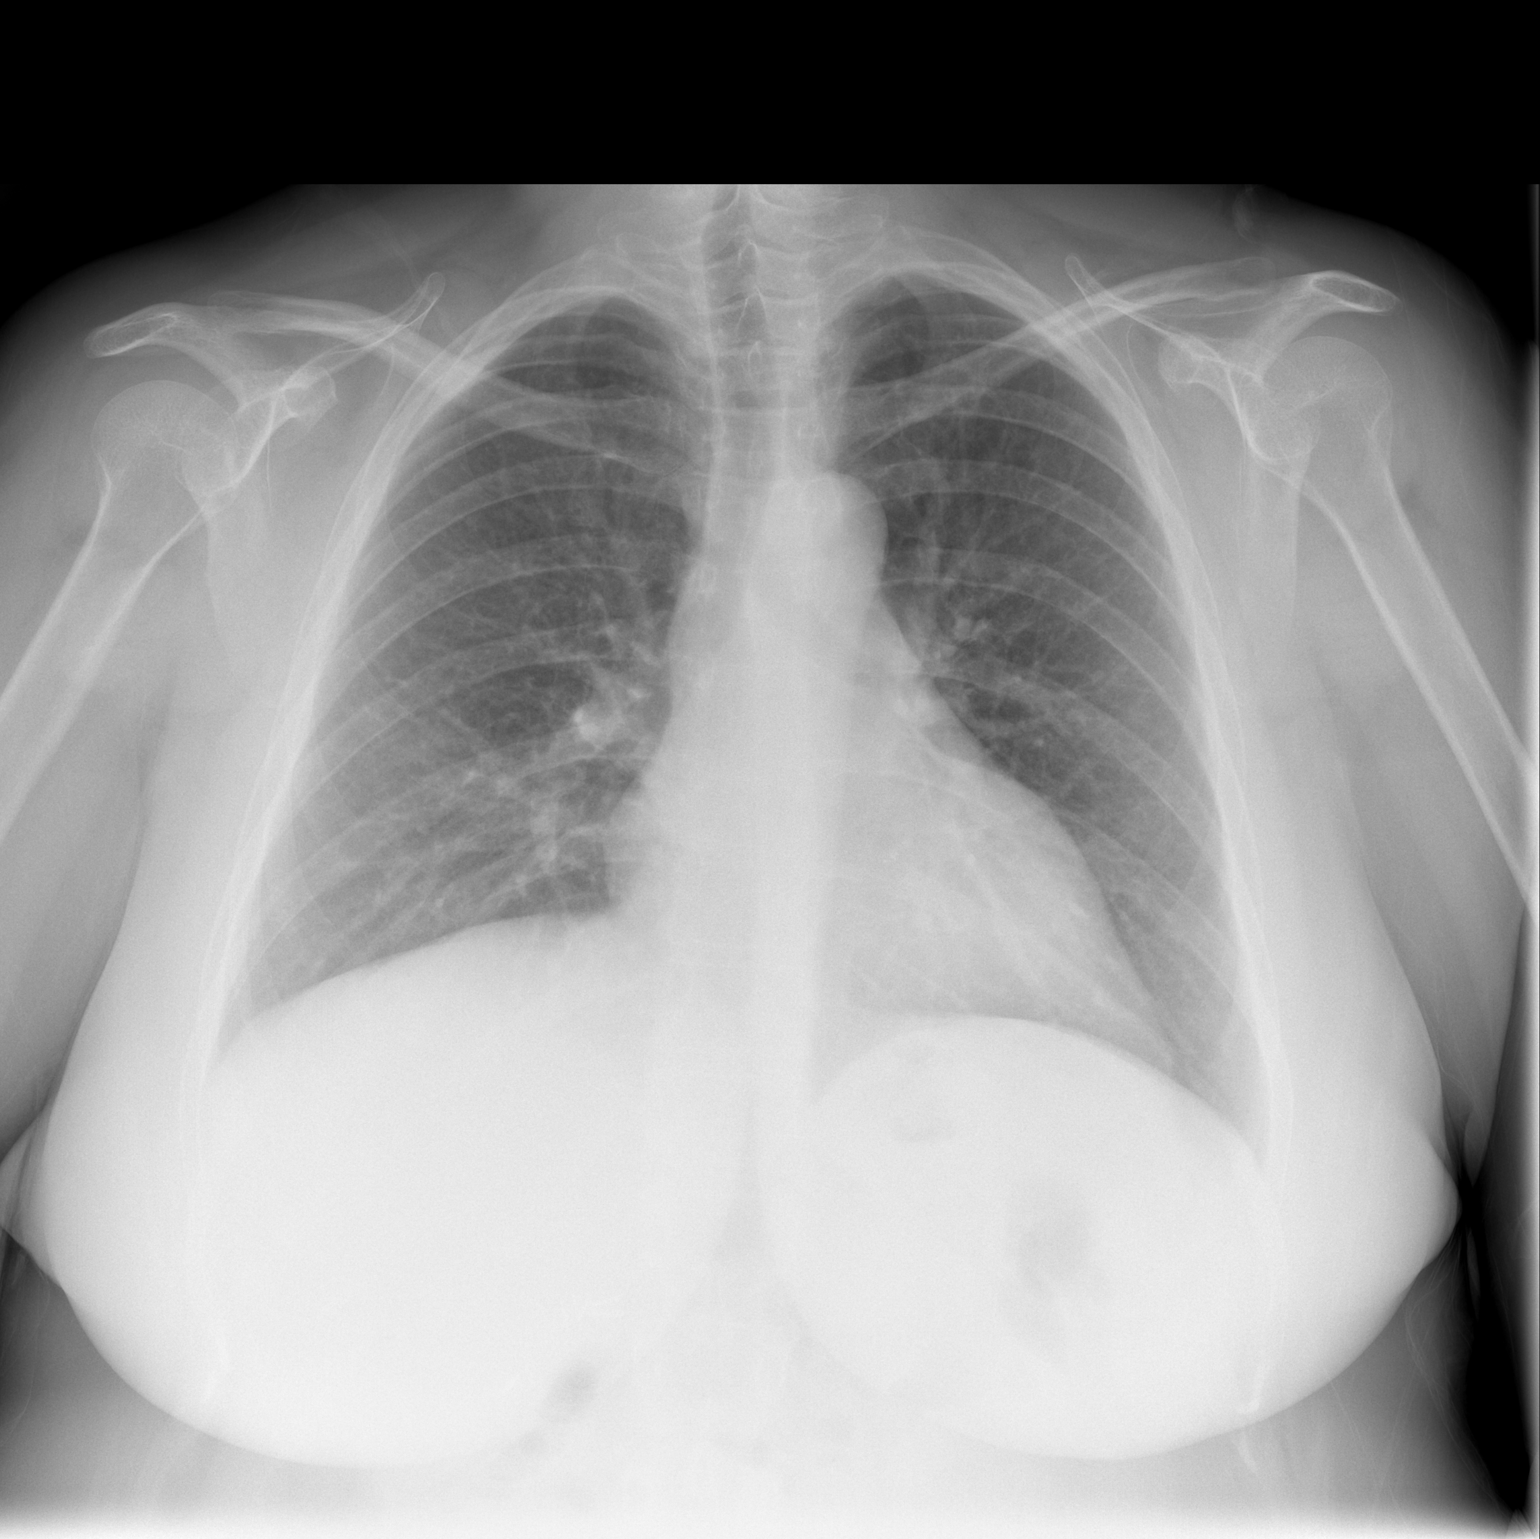

[w chest lat]
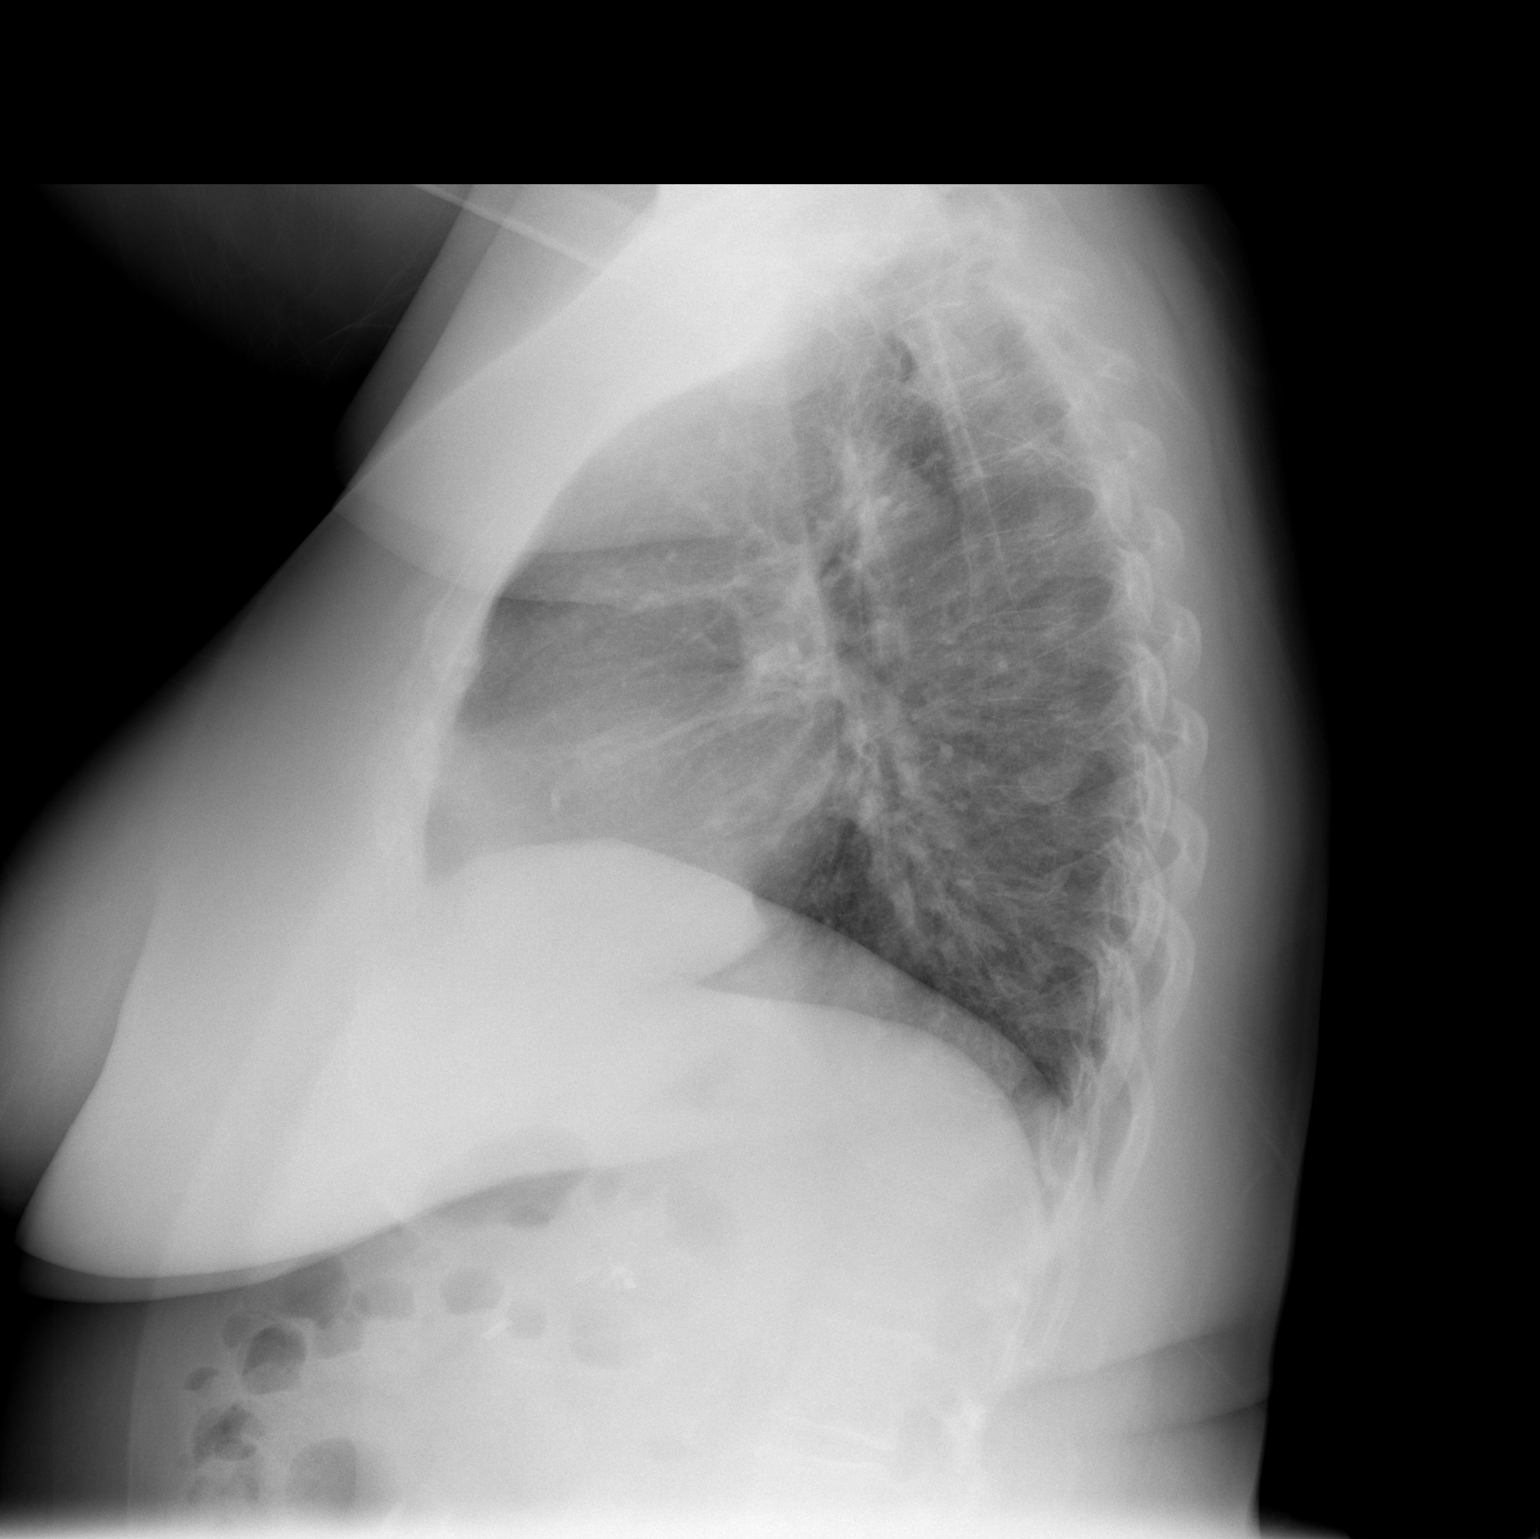

[2 of 2 positions shown; findings below may reference images not displayed]

FINDINGS: Upper-normal size of cardiac silhouette.

Mediastinal contours and pulmonary vascularity normal.

Lungs clear.

No pleural effusion or pneumothorax.

Bones slightly demineralized.
IMPRESSION: No acute abnormalities.

## 2015-02-06 ENCOUNTER — Other Ambulatory Visit: Payer: Self-pay | Admitting: Neurology

## 2015-02-06 MED ORDER — OMEPRAZOLE 40 MG PO CPDR: 40.0000 mg | DELAYED_RELEASE_CAPSULE | Freq: Every day | ORAL | Status: DC

## 2015-02-19 HISTORY — PX: OTHER SURGICAL HISTORY: SHX169

## 2015-03-08 ENCOUNTER — Other Ambulatory Visit: Payer: Self-pay

## 2015-03-08 MED ORDER — OMEPRAZOLE 40 MG PO CPDR: 40.0000 mg | DELAYED_RELEASE_CAPSULE | Freq: Every day | ORAL | Status: DC

## 2015-05-31 ENCOUNTER — Other Ambulatory Visit: Payer: Self-pay | Admitting: *Deleted

## 2015-05-31 MED ORDER — MOMETASONE FUROATE 50 MCG/ACT NA SUSP: 1.0000 | Freq: Every day | NASAL | Status: DC

## 2015-07-19 ENCOUNTER — Other Ambulatory Visit: Payer: Self-pay

## 2015-07-19 MED ORDER — BUDESONIDE 180 MCG/ACT IN AEPB: 2.0000 | INHALATION_SPRAY | Freq: Every day | RESPIRATORY_TRACT | Status: DC

## 2015-09-21 ENCOUNTER — Encounter: Payer: Self-pay | Admitting: Obstetrics and Gynecology

## 2015-09-27 ENCOUNTER — Encounter: Payer: Self-pay | Admitting: Obstetrics and Gynecology

## 2015-09-27 ENCOUNTER — Ambulatory Visit (INDEPENDENT_AMBULATORY_CARE_PROVIDER_SITE_OTHER): Payer: Commercial Managed Care - PPO | Admitting: Obstetrics and Gynecology

## 2015-09-27 VITALS — BP 124/78 | HR 76 | Resp 14 | Ht 62.5 in | Wt 236.4 lb

## 2015-09-27 DIAGNOSIS — Z113 Encounter for screening for infections with a predominantly sexual mode of transmission: Secondary | ICD-10-CM | POA: Diagnosis not present

## 2015-09-27 DIAGNOSIS — R7989 Other specified abnormal findings of blood chemistry: Secondary | ICD-10-CM

## 2015-09-27 DIAGNOSIS — E039 Hypothyroidism, unspecified: Secondary | ICD-10-CM

## 2015-09-27 DIAGNOSIS — Z0000 Encounter for general adult medical examination without abnormal findings: Secondary | ICD-10-CM

## 2015-09-27 DIAGNOSIS — Z01419 Encounter for gynecological examination (general) (routine) without abnormal findings: Secondary | ICD-10-CM | POA: Diagnosis not present

## 2015-09-27 MED ORDER — SERTRALINE HCL 100 MG PO TABS: 100.0000 mg | ORAL_TABLET | Freq: Every day | ORAL | 3 refills | Status: DC

## 2015-09-27 MED ORDER — LEVOTHYROXINE SODIUM 50 MCG PO TABS: 50.0000 ug | ORAL_TABLET | Freq: Every day | ORAL | 3 refills | Status: AC

## 2015-09-27 NOTE — Patient Instructions (Signed)
Health Maintenance, Female Adopting a healthy lifestyle and getting preventive care can go a long way to promote health and wellness. Talk with your health care provider about what schedule of regular examinations is right for you. This is a good chance for you to check in with your provider about disease prevention and staying healthy. In between checkups, there are plenty of things you can do on your own. Experts have done a lot of research about which lifestyle changes and preventive measures are most likely to keep you healthy. Ask your health care provider for more information. WEIGHT AND DIET  Eat a healthy diet  Be sure to include plenty of vegetables, fruits, low-fat dairy products, and lean protein.  Do not eat a lot of foods high in solid fats, added sugars, or salt.  Get regular exercise. This is one of the most important things you can do for your health.  Most adults should exercise for at least 150 minutes each week. The exercise should increase your heart rate and make you sweat (moderate-intensity exercise).  Most adults should also do strengthening exercises at least twice a week. This is in addition to the moderate-intensity exercise.  Maintain a healthy weight  Body mass index (BMI) is a measurement that can be used to identify possible weight problems. It estimates body fat based on height and weight. Your health care provider can help determine your BMI and help you achieve or maintain a healthy weight.  For females 20 years of age and older:   A BMI below 18.5 is considered underweight.  A BMI of 18.5 to 24.9 is normal.  A BMI of 25 to 29.9 is considered overweight.  A BMI of 30 and above is considered obese.  Watch levels of cholesterol and blood lipids  You should start having your blood tested for lipids and cholesterol at 52 years of age, then have this test every 5 years.  You may need to have your cholesterol levels checked more often if:  Your lipid  or cholesterol levels are high.  You are older than 52 years of age.  You are at high risk for heart disease.  CANCER SCREENING   Lung Cancer  Lung cancer screening is recommended for adults 55-80 years old who are at high risk for lung cancer because of a history of smoking.  A yearly low-dose CT scan of the lungs is recommended for people who:  Currently smoke.  Have quit within the past 15 years.  Have at least a 30-pack-year history of smoking. A pack year is smoking an average of one pack of cigarettes a day for 1 year.  Yearly screening should continue until it has been 15 years since you quit.  Yearly screening should stop if you develop a health problem that would prevent you from having lung cancer treatment.  Breast Cancer  Practice breast self-awareness. This means understanding how your breasts normally appear and feel.  It also means doing regular breast self-exams. Let your health care provider know about any changes, no matter how small.  If you are in your 20s or 30s, you should have a clinical breast exam (CBE) by a health care provider every 1-3 years as part of a regular health exam.  If you are 40 or older, have a CBE every year. Also consider having a breast X-ray (mammogram) every year.  If you have a family history of breast cancer, talk to your health care provider about genetic screening.  If you   are at high risk for breast cancer, talk to your health care provider about having an MRI and a mammogram every year.  Breast cancer gene (BRCA) assessment is recommended for women who have family members with BRCA-related cancers. BRCA-related cancers include:  Breast.  Ovarian.  Tubal.  Peritoneal cancers.  Results of the assessment will determine the need for genetic counseling and BRCA1 and BRCA2 testing. Cervical Cancer Your health care provider may recommend that you be screened regularly for cancer of the pelvic organs (ovaries, uterus, and  vagina). This screening involves a pelvic examination, including checking for microscopic changes to the surface of your cervix (Pap test). You may be encouraged to have this screening done every 3 years, beginning at age 21.  For women ages 30-65, health care providers may recommend pelvic exams and Pap testing every 3 years, or they may recommend the Pap and pelvic exam, combined with testing for human papilloma virus (HPV), every 5 years. Some types of HPV increase your risk of cervical cancer. Testing for HPV may also be done on women of any age with unclear Pap test results.  Other health care providers may not recommend any screening for nonpregnant women who are considered low risk for pelvic cancer and who do not have symptoms. Ask your health care provider if a screening pelvic exam is right for you.  If you have had past treatment for cervical cancer or a condition that could lead to cancer, you need Pap tests and screening for cancer for at least 20 years after your treatment. If Pap tests have been discontinued, your risk factors (such as having a new sexual partner) need to be reassessed to determine if screening should resume. Some women have medical problems that increase the chance of getting cervical cancer. In these cases, your health care provider may recommend more frequent screening and Pap tests. Colorectal Cancer  This type of cancer can be detected and often prevented.  Routine colorectal cancer screening usually begins at 52 years of age and continues through 52 years of age.  Your health care provider may recommend screening at an earlier age if you have risk factors for colon cancer.  Your health care provider may also recommend using home test kits to check for hidden blood in the stool.  A small camera at the end of a tube can be used to examine your colon directly (sigmoidoscopy or colonoscopy). This is done to check for the earliest forms of colorectal  cancer.  Routine screening usually begins at age 50.  Direct examination of the colon should be repeated every 5-10 years through 52 years of age. However, you may need to be screened more often if early forms of precancerous polyps or small growths are found. Skin Cancer  Check your skin from head to toe regularly.  Tell your health care provider about any new moles or changes in moles, especially if there is a change in a mole's shape or color.  Also tell your health care provider if you have a mole that is larger than the size of a pencil eraser.  Always use sunscreen. Apply sunscreen liberally and repeatedly throughout the day.  Protect yourself by wearing long sleeves, pants, a wide-brimmed hat, and sunglasses whenever you are outside. HEART DISEASE, DIABETES, AND HIGH BLOOD PRESSURE   High blood pressure causes heart disease and increases the risk of stroke. High blood pressure is more likely to develop in:  People who have blood pressure in the high end   of the normal range (130-139/85-89 mm Hg).  People who are overweight or obese.  People who are African American.  If you are 38-23 years of age, have your blood pressure checked every 3-5 years. If you are 61 years of age or older, have your blood pressure checked every year. You should have your blood pressure measured twice--once when you are at a hospital or clinic, and once when you are not at a hospital or clinic. Record the average of the two measurements. To check your blood pressure when you are not at a hospital or clinic, you can use:  An automated blood pressure machine at a pharmacy.  A home blood pressure monitor.  If you are between 45 years and 39 years old, ask your health care provider if you should take aspirin to prevent strokes.  Have regular diabetes screenings. This involves taking a blood sample to check your fasting blood sugar level.  If you are at a normal weight and have a low risk for diabetes,  have this test once every three years after 52 years of age.  If you are overweight and have a high risk for diabetes, consider being tested at a younger age or more often. PREVENTING INFECTION  Hepatitis B  If you have a higher risk for hepatitis B, you should be screened for this virus. You are considered at high risk for hepatitis B if:  You were born in a country where hepatitis B is common. Ask your health care provider which countries are considered high risk.  Your parents were born in a high-risk country, and you have not been immunized against hepatitis B (hepatitis B vaccine).  You have HIV or AIDS.  You use needles to inject street drugs.  You live with someone who has hepatitis B.  You have had sex with someone who has hepatitis B.  You get hemodialysis treatment.  You take certain medicines for conditions, including cancer, organ transplantation, and autoimmune conditions. Hepatitis C  Blood testing is recommended for:  Everyone born from [birth year] through 1967.  Anyone with known risk factors for hepatitis C. Sexually transmitted infections (STIs)  You should be screened for sexually transmitted infections (STIs) including gonorrhea and chlamydia if:  You are sexually active and are younger than 52 years of age.  You are older than 53 years of age and your health care provider tells you that you are at risk for this type of infection.  Your sexual activity has changed since you were last screened and you are at an increased risk for chlamydia or gonorrhea. Ask your health care provider if you are at risk.  If you do not have HIV, but are at risk, it may be recommended that you take a prescription medicine daily to prevent HIV infection. This is called pre-exposure prophylaxis (PrEP). You are considered at risk if:  You are sexually active and do not regularly use condoms or know the HIV status of your partner(s).  You take drugs by injection.  You are sexually  active with a partner who has HIV. Talk with your health care provider about whether you are at high risk of being infected with HIV. If you choose to begin PrEP, you should first be tested for HIV. You should then be tested every 3 months for as long as you are taking PrEP.  PREGNANCY   If you are premenopausal and you may become pregnant, ask your health care provider about preconception counseling.  If you may  become pregnant, take 400 to 800 micrograms (mcg) of folic acid every day.  If you want to prevent pregnancy, talk to your health care provider about birth control (contraception). OSTEOPOROSIS AND MENOPAUSE   Osteoporosis is a disease in which the bones lose minerals and strength with aging. This can result in serious bone fractures. Your risk for osteoporosis can be identified using a bone density scan.  If you are 61 years of age or older, or if you are at risk for osteoporosis and fractures, ask your health care provider if you should be screened.  Ask your health care provider whether you should take a calcium or vitamin D supplement to lower your risk for osteoporosis.  Menopause may have certain physical symptoms and risks.  Hormone replacement therapy may reduce some of these symptoms and risks. Talk to your health care provider about whether hormone replacement therapy is right for you.  HOME CARE INSTRUCTIONS   Schedule regular health, dental, and eye exams.  Stay current with your immunizations.   Do not use any tobacco products including cigarettes, chewing tobacco, or electronic cigarettes.  If you are pregnant, do not drink alcohol.  If you are breastfeeding, limit how much and how often you drink alcohol.  Limit alcohol intake to no more than 1 drink per day for nonpregnant women. One drink equals 12 ounces of beer, 5 ounces of wine, or 1 ounces of hard liquor.  Do not use street drugs.  Do not share needles.  Ask your health care provider for help if  you need support or information about quitting drugs.  Tell your health care provider if you often feel depressed.  Tell your health care provider if you have ever been abused or do not feel safe at home.   This information is not intended to replace advice given to you by your health care provider. Make sure you discuss any questions you have with your health care provider.   Document Released: 08/20/2010 Document Revised: 02/25/2014 Document Reviewed: 01/06/2013 Elsevier Interactive Patient Education [employer].

## 2015-09-27 NOTE — Progress Notes (Signed)
50 y.o. [id number] Married Caucasian female here for annual exam.   Former patient of Dr. Carren Rang.   Status post hysterectomy and BSO for endometriosis. No diagnosis of cancer.  Vaginal dryness.  Uses water based lubricant.   States Dr. Carren Rang managed her thyroid.  Status post partial thyroidectomy.   He was treating her hot flashes with Zoloft.  Did with Climara patch for 9 years.  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   Broke her foot in January and had poor healing.  Did BMD and dx with osteoporosis.  Also had low vit D that Raliegh Ip is managing her bone health.   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  Has slightly elevated cholesterol, about 205.   Has 4 grandchildren and one on the way.   PCP:   Christella Noa, MD  No LMP recorded. Patient has had a hysterectomy.           Sexually active: Yes.  female  The current method of family planning is status post hysterectomy.    Exercising: Yes.    Walking and swimming Smoker:  Former  Health Maintenance: Pap:  06/2014 normal per patient History of abnormal Pap:  Yes, 1992 hx cryotherapy to cervix and patient states had abnormal pap 2004 and led to Hysterectomy along with endometriosis. MMG:  09/01/2015  BI-RADS2, density category B:[facility] Colonoscopy:  02/2012 normal per patient:Dr. Butler--[place];next due 2024 BMD:   07/2015  Result  Osteoporosis:[facility].  T score -2.4 but osteoporosis due to fragility fx.  TDaP:  PCP Gardasil:   N/A HIV: probably tested in pregnancy.  Hep C:  Today.  Screening Labs:  Hb today: 12.2, Urine today: Neg   reports that she quit smoking about 5 years ago. Her smoking use included Cigarettes. She has never used smokeless tobacco. She reports that she does not drink alcohol or use drugs.  Past Medical History:  Diagnosis Date  . Abnormal Pap smear of cervix 1992   hx cryotherapy to cervix/also abn.pap 2004--Hyst  . Anemia    2015 to present  . Anxiety   . Arthritis    hands,  legs  . Cavernous malformation    "indicative of brain lipoma"; is followed at [facility]  . Complication of anesthesia    hard to wake up post-op  . Endometriosis   . Fibroid   . GERD (gastroesophageal reflux disease)   . Headache(784.0)    migraines  . Hypothyroidism    Hx Rt.thyroid nodule--removed 1991 in [place]  . 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 Motion sickness    due to cavernous malformation  . Osteoporosis     Past Surgical History:  Procedure Laterality Date  . BREAST LUMPECTOMY WITH NEEDLE LOCALIZATION Left 05/27/2012   Procedure: BREAST LUMPECTOMY WITH NEEDLE LOCALIZATION;  Surgeon: Imogene Burn. Georgette Dover, MD;  Location: [place];  Service: General;  Laterality: Left;  needle localization at breast center of [place]   . BREAST SURGERY  2014   Left lumpectomy--benign mass  . broken foot Left 02/2015  . CESAREAN SECTION  1985  . CHOLECYSTECTOMY  1994  . EVALUATION UNDER ANESTHESIA WITH ANAL FISTULECTOMY  2011  . OOPHORECTOMY  2004   BSO--endometriosis  . THYROID LOBECTOMY  1990  . TOTAL ABDOMINAL HYSTERECTOMY  04/20/2002   with left uterolysis/BSO  . TUBAL LIGATION  1991    Current Outpatient Prescriptions  Medication Sig Dispense Refill  . albuterol (PROAIR HFA) 108 (90  BASE) MCG/ACT inhaler Inhale 2 puffs into the lungs every 4 (four) hours as needed for wheezing or shortness of breath.    . budesonide (PULMICORT FLEXHALER) 180 MCG/ACT inhaler Inhale 2 puffs into the lungs daily. 1 each 1  . Fluticasone-Salmeterol (AIRDUO RESPICLICK [id number]) 0000000 MCG/ACT AEPB Inhale 1 puff into the lungs as needed.    Marland Kitchen levocetirizine (XYZAL) 5 MG tablet Take 5 mg by mouth at bedtime.  5  . levothyroxine (SYNTHROID, LEVOTHROID) 50 MCG tablet Take 50 mcg by mouth daily.  3  . Multiple Vitamin (MULTIVITAMIN) tablet Take 1 tablet by mouth daily.    . naproxen sodium (ANAPROX) 220 MG tablet Take 220 mg by mouth 2 (two) times daily with a meal.    . omeprazole  (PRILOSEC) 40 MG capsule Take 1 capsule (40 mg total) by mouth daily. 30 capsule 5  . vitamin B-12 (CYANOCOBALAMIN) 500 MCG tablet Take 500 mcg by mouth daily.     No current facility-administered medications for this visit.     Family History  Problem Relation Age of Onset  . Cancer Mother     kidney  . Anesthesia problems Mother     post-op N/V  . Cancer Father     melanoma  . Hypertension Father   . Diabetes Maternal Grandmother     ROS:  Pertinent items are noted in HPI.  Otherwise, a comprehensive ROS was negative.  Exam:   BP 124/78 (BP Location: Right Arm, Patient Position: Sitting, Cuff Size: Large)   Pulse 76   Resp 14   Ht 5' 2.5" (1.588 m)   Wt 236 lb 6.4 oz (107.2 kg)   BMI 42.55 kg/m     General appearance: alert, cooperative and appears stated age Head: Normocephalic, without obvious abnormality, atraumatic Neck: no adenopathy, supple, symmetrical, trachea midline and thyroid normal to inspection and palpation Lungs: clear to auscultation bilaterally Breasts: normal appearance, no masses or tenderness, No nipple retraction or dimpling, No nipple discharge or bleeding, No axillary or supraclavicular adenopathy Heart: regular rate and rhythm Abdomen: soft, non-tender; no masses, no organomegaly Extremities: extremities normal, atraumatic, no cyanosis or edema Skin: Skin color, texture, turgor normal. No rashes or lesions Lymph nodes: Cervical, supraclavicular, and axillary nodes normal. No abnormal inguinal nodes palpated Neurologic: Grossly normal  Pelvic: External genitalia:  no lesions              Urethra:  normal appearing urethra with no masses, tenderness or lesions              Bartholins and Skenes: normal                 Vagina: normal appearing vagina with normal color and discharge, no lesions              Cervix: absent              Pap taken: No. Bimanual Exam:  Uterus:  Absent.              Adnexa: no mass, fullness, tenderness               Rectal exam: Yes.  .  Confirms.              Anus:  normal sphincter tone, no lesions  Chaperone was present for exam.  Assessment:   Well woman visit with normal exam. Status post TAH/BSO for endometriosis.  Osteoporosis.  Low vit D. Hypothyroidism.  Menopausal symptoms.  On Zoloft.  Atrophy.  Plan: Yearly mammogram recommended after age 9.  Recommended self breast exam.  Pap and HR HPV as above. Discussed Calcium, Vitamin D, regular exercise program including cardiovascular and weight bearing exercise. Check vit D, TFTs, and hep C aby.  Discussed cooking oils and possible vaginal estrogen in the future if needed. Refill of Synthroid and Zoloft.  Follow up annually and prn.       After visit summary provided.

## 2015-09-28 LAB — THYROID PANEL WITH TSH
Free Thyroxine Index: 2.3 (ref 1.4–3.8)
T3 UPTAKE: 26 % (ref 22–35)
T4, Total: 8.9 ug/dL (ref 4.5–12.0)
TSH: 2.6 mIU/L

## 2015-09-28 LAB — POCT URINALYSIS DIPSTICK
BILIRUBIN UA: NEGATIVE
GLUCOSE UA: NEGATIVE
Ketones, UA: NEGATIVE
Leukocytes, UA: NEGATIVE
Nitrite, UA: NEGATIVE
Protein, UA: NEGATIVE
RBC UA: NEGATIVE
UROBILINOGEN UA: NEGATIVE
pH, UA: 5

## 2015-09-28 LAB — VITAMIN D 25 HYDROXY (VIT D DEFICIENCY, FRACTURES): Vit D, 25-Hydroxy: 60 ng/mL (ref 30–100)

## 2015-09-28 LAB — HEMOGLOBIN, FINGERSTICK: HEMOGLOBIN, FINGERSTICK: 12.2 g/dL (ref 12.0–16.0)

## 2015-09-28 LAB — HEPATITIS C ANTIBODY: HCV Ab: NEGATIVE

## 2015-10-03 ENCOUNTER — Telehealth: Payer: Self-pay | Admitting: *Deleted

## 2015-10-03 NOTE — Telephone Encounter (Signed)
Patient called back and I went over results -eh

## 2015-10-03 NOTE — Telephone Encounter (Signed)
-----   Message from Nunzio Cobbs, MD sent at 09/28/2015  5:54 AM EDT ----- Please report normal vitamin D and thyroid function. Hep C aby negative.

## 2015-10-03 NOTE — Telephone Encounter (Signed)
Left message to call regarding lab results -eh 

## 2015-10-28 ENCOUNTER — Other Ambulatory Visit: Payer: Self-pay | Admitting: Allergy and Immunology

## 2015-11-30 ENCOUNTER — Other Ambulatory Visit: Payer: Self-pay | Admitting: Allergy and Immunology

## 2015-12-24 ENCOUNTER — Other Ambulatory Visit: Payer: Self-pay | Admitting: Allergy and Immunology

## 2016-03-14 ENCOUNTER — Other Ambulatory Visit: Payer: Self-pay | Admitting: Allergy and Immunology

## 2016-03-26 ENCOUNTER — Encounter: Payer: Self-pay | Admitting: Allergy and Immunology

## 2016-03-26 ENCOUNTER — Ambulatory Visit (INDEPENDENT_AMBULATORY_CARE_PROVIDER_SITE_OTHER): Payer: Commercial Managed Care - PPO | Admitting: Allergy and Immunology

## 2016-03-26 VITALS — BP 128/70 | HR 84 | Resp 16 | Ht 62.0 in | Wt 241.6 lb

## 2016-03-26 DIAGNOSIS — J3089 Other allergic rhinitis: Secondary | ICD-10-CM

## 2016-03-26 DIAGNOSIS — J4530 Mild persistent asthma, uncomplicated: Secondary | ICD-10-CM | POA: Diagnosis not present

## 2016-03-26 DIAGNOSIS — K219 Gastro-esophageal reflux disease without esophagitis: Secondary | ICD-10-CM

## 2016-03-26 MED ORDER — OMEPRAZOLE 40 MG PO CPDR
40.0000 mg | DELAYED_RELEASE_CAPSULE | Freq: Every day | ORAL | 11 refills | Status: DC
Start: 2016-03-26 — End: 2017-03-23

## 2016-03-26 MED ORDER — RANITIDINE HCL 150 MG PO TABS: 300.0000 mg | ORAL_TABLET | Freq: Every day | ORAL | 11 refills | Status: AC

## 2016-03-26 MED ORDER — ALBUTEROL SULFATE 108 (90 BASE) MCG/ACT IN AEPB: 2.0000 | INHALATION_SPRAY | Freq: Four times a day (QID) | RESPIRATORY_TRACT | 3 refills | Status: AC | PRN

## 2016-03-26 MED ORDER — BUDESONIDE 180 MCG/ACT IN AEPB: 2.0000 | INHALATION_SPRAY | Freq: Every day | RESPIRATORY_TRACT | 11 refills | Status: DC

## 2016-03-26 NOTE — Progress Notes (Signed)
Follow-up Note  Referring Provider: Orpah Melter, MD Primary Provider: Orpah Melter, MD Date of Office Visit: 03/26/2016  Subjective:   [name] (DOB: 1965-05-20) is a 51 y.o. female who returns to the [facility] on 03/26/2016 in re-evaluation of the following:  HPI: [name] returns to this clinic in reevaluation of her asthma, allergic rhinitis, and reflux-induced respiratory disease. I've not seen her in this clinic in 2 years.  She is doing wonderful regarding her asthma as long as she consistently uses her Pulmicort. She has not required a systemic steroids for an exacerbation and she rarely uses her short-acting bronchodilator. If she misses Pulmicort after several days she does get dyspnea and chest tightness.  She's had very little problems with her nose. She has not required an antibiotic. She did require Tamiflu a month ago for an episode of influenza which fortunately she resolved.  Her reflux and her throat issue are under excellent control with aggressive therapy directed against reflux.  She did obtain the flu vaccine.  Allergies as of 03/26/2016      Reactions   Sesame Oil Rash   Sulfa Antibiotics Rash   Sulfamethoxazole Rash      Medication List      budesonide 180 MCG/ACT inhaler Commonly known as:  PULMICORT FLEXHALER Inhale 2 puffs into the lungs daily.   levocetirizine 5 MG tablet Commonly known as:  XYZAL Take 5 mg by mouth at bedtime.   levothyroxine 50 MCG tablet Commonly known as:  SYNTHROID, LEVOTHROID Take 1 tablet (50 mcg total) by mouth daily.   multivitamin tablet Take 1 tablet by mouth daily.   naproxen sodium 220 MG tablet Commonly known as:  ANAPROX Take 220 mg by mouth 2 (two) times daily with a meal.   omeprazole 40 MG capsule Commonly known as:  PRILOSEC Take 1 capsule (40 mg total) by mouth daily.   PROAIR HFA 108 (90 Base) MCG/ACT inhaler Generic drug:  albuterol Inhale 2 puffs into the lungs every 4  (four) hours as needed for wheezing or shortness of breath.   sertraline 100 MG tablet Commonly known as:  ZOLOFT Take 1 tablet (100 mg total) by mouth daily.   triamcinolone cream 0.1 % Commonly known as:  KENALOG APPLY 2-3 TIMES DAILY AS NEEDED FOR ITCHY AREAS FOR 2-3 WEEKS   VIMOVO 500-20 MG Tbec Generic drug:  Naproxen-Esomeprazole Take 1 tablet by mouth 2 (two) times daily.       Past Medical History:  Diagnosis Date  . Abnormal Pap smear of cervix 1992   hx cryotherapy to cervix/also abn.pap 2004--Hyst  . Anemia    2015 to present  . Anxiety   . Arthritis    hands, legs  . Cavernous malformation    "indicative of brain lipoma"; is followed at [facility]  . Complication of anesthesia    hard to wake up post-op  . Endometriosis   . Fibroid   . GERD (gastroesophageal reflux disease)   . Headache(784.0)    migraines  . Hypothyroidism    Hx Rt.thyroid nodule--removed 1991 in [place]  . 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 Motion sickness    due to cavernous malformation  . Osteoporosis     Past Surgical History:  Procedure Laterality Date  . BREAST LUMPECTOMY WITH NEEDLE LOCALIZATION Left 05/27/2012   Procedure: BREAST LUMPECTOMY WITH NEEDLE LOCALIZATION;  Surgeon: Imogene Burn. Georgette Dover, MD;  Location: [place];  Service: General;  Laterality: Left;  needle localization at breast center of [place]   . BREAST SURGERY  2014   Left lumpectomy--benign mass  . broken foot Left 02/2015  . CESAREAN SECTION  1985  . CHOLECYSTECTOMY  1994  . EVALUATION UNDER ANESTHESIA WITH ANAL FISTULECTOMY  2011  . OOPHORECTOMY  2004   BSO--endometriosis  . THYROID LOBECTOMY  1990  . TOTAL ABDOMINAL HYSTERECTOMY  04/20/2002   with left uterolysis/BSO  . TUBAL LIGATION  1991    Review of systems negative except as noted in HPI / PMHx or noted below:  Review of Systems  Constitutional: Negative.   HENT: Negative.   Eyes: Negative.   Respiratory: Negative.     Cardiovascular: Negative.   Gastrointestinal: Negative.   Genitourinary: Negative.   Musculoskeletal: Negative.   Skin: Negative.   Neurological: Negative.   Endo/Heme/Allergies: Negative.   Psychiatric/Behavioral: Negative.      Objective:   Vitals:   03/26/16 1617  BP: 128/70  Pulse: 84  Resp: 16   Height: 5\' 2"  (157.5 cm)  Weight: 241 lb 9.6 oz (109.6 kg)   Physical Exam  Constitutional: She is well-developed, well-nourished, and in no distress.  HENT:  Head: Normocephalic.  Right Ear: Tympanic membrane, external ear and ear canal normal.  Left Ear: Tympanic membrane, external ear and ear canal normal.  Nose: Nose normal. No mucosal edema or rhinorrhea.  Mouth/Throat: Uvula is midline, oropharynx is clear and moist and mucous membranes are normal. No oropharyngeal exudate.  Eyes: Conjunctivae are normal.  Neck: Trachea normal. No tracheal tenderness present. No tracheal deviation present. No thyromegaly present.  Cardiovascular: Normal rate, regular rhythm, S1 normal, S2 normal and normal heart sounds.   No murmur heard. Pulmonary/Chest: Breath sounds normal. No stridor. No respiratory distress. She has no wheezes. She has no rales.  Musculoskeletal: She exhibits no edema.  Lymphadenopathy:       Head (right side): No tonsillar adenopathy present.       Head (left side): No tonsillar adenopathy present.    She has no cervical adenopathy.  Neurological: She is alert. Gait normal.  Skin: No rash noted. She is not diaphoretic. No erythema. Nails show no clubbing.  Psychiatric: Mood and affect normal.    Diagnostics:    Spirometry was performed and demonstrated an FEV1 of 2.12 at 89 % of predicted.  Assessment and Plan:   1. Asthma, well controlled, mild persistent   2. Other allergic rhinitis   3. LPRD (laryngopharyngeal reflux disease)     1. Continue Pulmicort 180 - 2 inhalations one time per day  2. Continue combination of omeprazole 40 mg in a.m. plus  ranitidine 300 mg in PM  3. Continue pro-air Respiclick 2 puffs every 4-6 hours and antihistamine if needed   4. Return to clinic in 1 year or earlier if problem  [name] appears to be doing very well on her current medical plan which includes anti-inflammatory medications for her respiratory tract and aggressive therapy directed against reflux. I will see her back in this clinic in approximately one year or earlier if there is a problem.  Allena Katz, MD Allergy / Immunology [place]

## 2016-03-26 NOTE — Patient Instructions (Addendum)
  1. Continue Pulmicort 180 - 2 inhalations one time per day  2. Continue combination of omeprazole 40 mg in a.m. plus ranitidine 300 mg in PM  3. Continue pro-air Respiclick 2 puffs every 4-6 hours and antihistamine if needed   4. Return to clinic in 1 year or earlier if problem

## 2016-04-02 ENCOUNTER — Ambulatory Visit: Payer: Commercial Managed Care - PPO | Admitting: Physical Therapy

## 2016-04-08 ENCOUNTER — Ambulatory Visit (INDEPENDENT_AMBULATORY_CARE_PROVIDER_SITE_OTHER): Payer: Commercial Managed Care - PPO | Admitting: Physical Therapy

## 2016-04-08 ENCOUNTER — Encounter: Payer: Self-pay | Admitting: Physical Therapy

## 2016-04-08 DIAGNOSIS — M62838 Other muscle spasm: Secondary | ICD-10-CM | POA: Diagnosis not present

## 2016-04-08 DIAGNOSIS — G8929 Other chronic pain: Secondary | ICD-10-CM

## 2016-04-08 DIAGNOSIS — M5442 Lumbago with sciatica, left side: Secondary | ICD-10-CM

## 2016-04-08 DIAGNOSIS — M6281 Muscle weakness (generalized): Secondary | ICD-10-CM

## 2016-04-08 NOTE — Patient Instructions (Signed)
3 part core exercise    With neutral spine, tighten pelvic floor, then tighten abdominal muscles sucking your belly button to back bone, then tighten muscles in low back at waist. Hold 10 seconds. Repeat _10_ times. Do _several__ times a day.  * When you have mastered this, you can contract all 3 muscle groups at same time.   * Progress to do this activity sitting, standing, walking and functional activities.    Knee to Chest (Flexion)    Pull knee toward chest. Feel stretch in lower back or buttock area. Breathing deeply, Hold __15-30__ seconds. Repeat with other knee. Repeat __2-3__ times. Do _2___ sessions per day.  Lower Trunk Rotation Stretch    Keeping back flat and feet together, rotate knees to left side. Hold _15___ seconds. Repeat _4_ times per set. Do __1__ sets per session. Do _2___ sessions per day.  Calf Stretch    With towel around forefoot, keep knee straight and pull back on towel until a stretch is felt in the calf. Hold __15-30__ seconds. Repeat __2-3__ times. Do __2__ sessions per day.   [facility] [place] [address] [place], [place] [address]  [phone] (office) [phone] (fax)

## 2016-04-08 NOTE — Therapy (Signed)
[place], [place], [address] Phone: [phone]   Fax:  [phone]  Physical Therapy Evaluation  Patient Details  Name: [name] MRN: [id number] Date of Birth: 07-20-1965 Referring Provider: Dr Wandra Feinstein  Encounter Date: 04/08/2016      PT End of Session - 04/08/16 1148    Visit Number 1   Number of Visits 8   Date for PT Re-Evaluation 05/06/16   PT Start Time [id number]   PT Stop Time 1300   PT Time Calculation (min) 72 min   Activity Tolerance Patient limited by pain      Past Medical History:  Diagnosis Date  . Abnormal Pap smear of cervix 1992   hx cryotherapy to cervix/also abn.pap 2004--Hyst  . Anemia    2015 to present  . Anxiety   . Arthritis    hands, legs  . Cavernous malformation    "indicative of brain lipoma"; is followed at [facility]  . Complication of anesthesia    hard to wake up post-op  . Endometriosis   . Fibroid   . GERD (gastroesophageal reflux disease)   . Headache(784.0)    migraines  . Hypothyroidism    Hx Rt.thyroid nodule--removed 1991 in [place]  . 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 Motion sickness    due to cavernous malformation  . Osteoporosis     Past Surgical History:  Procedure Laterality Date  . BREAST LUMPECTOMY WITH NEEDLE LOCALIZATION Left 05/27/2012   Procedure: BREAST LUMPECTOMY WITH NEEDLE LOCALIZATION;  Surgeon: Imogene Burn. Georgette Dover, MD;  Location: [place];  Service: General;  Laterality: Left;  needle localization at breast center of [place]   . BREAST SURGERY  2014   Left lumpectomy--benign mass  . broken foot Left 02/2015  . CESAREAN SECTION  1985  . CHOLECYSTECTOMY  1994  . EVALUATION UNDER ANESTHESIA WITH ANAL FISTULECTOMY  2011  . OOPHORECTOMY  2004   BSO--endometriosis  . THYROID LOBECTOMY  1990  . TOTAL ABDOMINAL HYSTERECTOMY  04/20/2002   with left uterolysis/BSO  . TUBAL LIGATION  1991    There  were no vitals filed for this visit.       Subjective Assessment - 04/08/16 1151    Subjective Pt reports that January of last year she fell and broke her Lt foot ( 5th metatarsal), ever since havingn to wear the boot for 4.5 months and NWBingto heal. Likes to walk at work on lunch and dance at home.  Saw a chiropractor for 8 wks and myofascial release therapist for 10-12 visits. The pain all returned. Then saw this MD and has bone densitiy scan.  Had injections in L5S1/hips  Nov 2017.  Had another epidural type injection 3 wks ago on the Lt side, only a day relief.    Pertinent History osteopenia in lumbar, pain and inflammation in her knees.  Getting a stand up desk at work.    How long can you sit comfortably? tolerates about 61' in a recliner, at work 30 min   How long can you walk comfortably? tolerates less 1/4 mile.    Diagnostic tests MRI - arthritis in bilat hips, Bone density - osteopenia   Patient Stated Goals walk and dance without as much pain, camp and play with Grandchildren   Currently in Pain? Yes   Pain Score 3    Pain Location Back   Pain Orientation Left   Pain Descriptors / Indicators Stabbing;Dull;Headache  Pain Type Chronic pain   Pain Radiating Towards down into Lt knee   Pain Onset More than a month ago   Pain Frequency Constant   Aggravating Factors  walking   Pain Relieving Factors heat, medication             [facility] PT Assessment - 04/08/16 0001      Assessment   Medical Diagnosis Low back pain   Referring Provider Dr Wandra Feinstein   Onset Date/Surgical Date 04/09/15   Next MD Visit 05/05/16   Prior Therapy not PT      Precautions   Precautions None     Balance Screen   Has the patient fallen in the past 6 months Yes   How many times? 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 ( with pain )      Posture/Postural Control   Posture/Postural Control Postural limitations   Postural Limitations --  extra abdominal girth     ROM / Strength   AROM / PROM / Strength AROM;Strength     AROM   AROM Assessment Site Lumbar;Ankle   Right/Left Ankle --  DF A/PROM Rt 5/7, Lt 3/9   Lumbar Flexion WNL, pain with returning to stand   Lumbar Extension decreased 25% with Lt SIJ pain   Lumbar - Right Side Bend WNl   Lumbar - Left Side Bend decreased 25% with pain   Lumbar - Right Rotation WNl   Lumbar - Left Rotation WNL with pain, side      Strength   Strength Assessment Site Hip;Knee;Ankle;Lumbar   Right/Left Hip Right;Left   Right Hip Flexion 5/5   Right Hip Extension 4+/5   Right Hip ABduction 4+/5   Left Hip Flexion 4/5   Left Hip Extension 4/5   Left Hip ABduction 4/5   Right/Left Knee --  WNL except Lt quad 4+/5   Right/Left Ankle Left  Rt WNL   Left Ankle Dorsiflexion 4/5   Left Ankle Plantar Flexion 4/5   Lumbar Flexion --  TA poor     Flexibility   Soft Tissue Assessment /Muscle Length yes   Hamstrings WNL   Quadriceps WNL     Palpation   Spinal mobility pain with bilat UPA mobs L4-5   Palpation comment tight and pain in the left lumbar paraspinals, gluts and piriformis                   [facility] Adult PT Treatment/Exercise - 04/08/16 0001      Exercises   Exercises  Lumbar;Knee/Hip     Lumbar Exercises: Stretches   Single Knee to Chest Stretch 2 reps;30  seconds   Lower Trunk Rotation 5 reps     Lumbar Exercises: Supine   Ab Set 5 reps;5 seconds     Knee/Hip Exercises: Stretches   Gastroc Stretch Right;Left;2 reps;30 seconds   Gastroc Stretch Limitations trial in standing, tolerated NWB with strap well.      Modalities   Modalities Electrical Stimulation;Moist Heat     Moist Heat Therapy   Number Minutes Moist Heat 15 Minutes   Moist Heat Location Lumbar Spine     Electrical Stimulation   Electrical Stimulation Location Lt lumbar paraspinals, Lt glute    Electrical Stimulation Action IFC   Electrical Stimulation Parameters to tolerance    Electrical Stimulation Goals Pain                PT Education - 04/08/16 1253    Education provided Yes   Education Details HEP and TENs   Person(s) Educated Patient   Methods Explanation;Demonstration;Handout   Comprehension Verbalized understanding;Returned demonstration             PT Long Term Goals - 04/08/16 1257      PT LONG TERM GOAL #1   Title I with advanced HEP to include a walking program ( 05/06/16)    Time 4   Period Weeks   Status New     PT LONG TERM GOAL #2   Title report Lt sciatica pain reduced =/> 75% with dailly activity ( 05/06/16)    Time 4   Period Weeks   Status New     PT LONG TERM GOAL #3   Title demo Lt LE strength =/> 5-/5 through out to allow her to dance at home ( 05/06/16)    Time 4   Period Weeks   Status New     PT LONG TERM GOAL #4   Title improve FOTO =/< 51% limited ( 05/06/16)    Time 4   Period Weeks   Status New     PT LONG TERM GOAL #5   Title increase bilat ankle dorsiflexion =/> 12 degrees bilat to allow for normalized gait ( 05/06/16)    Time 4   Period Weeks   Status New               Plan - 04/08/16 1254    Clinical Impression Statement 51 yo female presents for moderate complexity PT eval due to back pain over a  year.  It all started when she broke her left foot a year ago and was NWB in a boot for almost 5 months.  She has tried chiropractic care, myofascial therapy and injections without lasting relief.  She has tightness in her ankles, Lt hip and low back.  Muscular trigger points in the Lt lumbar muscles and buttocks. She also has weakness in the Lt lower leg.    Rehab Potential Good   PT Frequency 2x / week   PT Duration 4 weeks   PT Treatment/Interventions Moist Heat;Traction;Ultrasound;Therapeutic exercise;Therapeutic activities;Dry needling;Taping;Manual techniques;Neuromuscular re-education;Cryotherapy;Electrical Stimulation;Patient/family education   PT Next Visit Plan ankle stetching, core strengthening, manual work and modalities for pain   Consulted and Agree with Plan of Care Patient      Patient will benefit from skilled therapeutic intervention in order to improve the following deficits and impairments:  Decreased strength, Pain, Improper body mechanics, Decreased range of motion, Increased muscle spasms  Visit Diagnosis: Chronic left-sided low back pain with left-sided sciatica - Plan: PT plan of care cert/re-cert  Muscle weakness (generalized) -  Plan: PT plan of care cert/re-cert  Other muscle spasm - Plan: PT plan of care cert/re-cert     Problem List Patient Active Problem List   Diagnosis Date Noted  . Moderate persistent asthma 10/29/2014  . Rhinitis 10/29/2014  . GERD (gastroesophageal reflux disease) 10/29/2014  . Intraductal papilloma of breast 05/01/2012    Jeral Pinch PT  04/08/2016, 1:01 PM  [facility] [place], [place], [address] Phone: [phone]   Fax:  [phone]  Name: [name] MRN: [id number] Date of Birth: 08-20-1965

## 2016-04-11 ENCOUNTER — Other Ambulatory Visit (HOSPITAL_BASED_OUTPATIENT_CLINIC_OR_DEPARTMENT_OTHER): Payer: Self-pay | Admitting: Family Medicine

## 2016-04-11 ENCOUNTER — Ambulatory Visit (INDEPENDENT_AMBULATORY_CARE_PROVIDER_SITE_OTHER): Payer: Commercial Managed Care - PPO | Admitting: Physical Therapy

## 2016-04-11 DIAGNOSIS — M5442 Lumbago with sciatica, left side: Secondary | ICD-10-CM | POA: Diagnosis not present

## 2016-04-11 DIAGNOSIS — E049 Nontoxic goiter, unspecified: Secondary | ICD-10-CM

## 2016-04-11 DIAGNOSIS — M6281 Muscle weakness (generalized): Secondary | ICD-10-CM

## 2016-04-11 DIAGNOSIS — M62838 Other muscle spasm: Secondary | ICD-10-CM

## 2016-04-11 DIAGNOSIS — G8929 Other chronic pain: Secondary | ICD-10-CM

## 2016-04-11 DIAGNOSIS — E041 Nontoxic single thyroid nodule: Secondary | ICD-10-CM

## 2016-04-11 NOTE — Patient Instructions (Addendum)
Piriformis Stretch    Lying on back, pull right knee toward opposite shoulder. Hold __15-30__ seconds. Repeat __2__ times. Do __2__ sessions per day.  [email or web address].[email or web address]    Supine: Leg Stretch With Strap (Intermediate)    Lie on back with one knee bent, foot flat on floor. Hook strap around other foot. Straighten knee. Raise leg further toward its maximal range. Hold __30_ seconds. Relax leg completely down to floor.  Repeat __2_ times per session. Do _1-2__ sessions per day.

## 2016-04-11 NOTE — Therapy (Signed)
[place], [place], [address] Phone: [phone]   Fax:  [phone]  Physical Therapy Treatment  Patient Details  Name: [name] MRN: [id number] Date of Birth: 10-03-1965 Referring Provider: Dr. Wandra Feinstein   Encounter Date: 04/11/2016      PT End of Session - 04/11/16 1128    Visit Number 2   Number of Visits 8   Date for PT Re-Evaluation 05/06/16   PT Start Time [id number]  pt arrived late   PT Stop Time 1152   PT Time Calculation (min) 41 min   Activity Tolerance Patient tolerated treatment well;No increased pain   Behavior During Therapy WFL for tasks assessed/performed      Past Medical History:  Diagnosis Date  . Abnormal Pap smear of cervix 1992   hx cryotherapy to cervix/also abn.pap 2004--Hyst  . Anemia    2015 to present  . Anxiety   . Arthritis    hands, legs  . Cavernous malformation    "indicative of brain lipoma"; is followed at [facility]  . Complication of anesthesia    hard to wake up post-op  . Endometriosis   . Fibroid   . GERD (gastroesophageal reflux disease)   . Headache(784.0)    migraines  . Hypothyroidism    Hx Rt.thyroid nodule--removed 1991 in [place]  . 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 Motion sickness    due to cavernous malformation  . Osteoporosis     Past Surgical History:  Procedure Laterality Date  . BREAST LUMPECTOMY WITH NEEDLE LOCALIZATION Left 05/27/2012   Procedure: BREAST LUMPECTOMY WITH NEEDLE LOCALIZATION;  Surgeon: Imogene Burn. Georgette Dover, MD;  Location: [place];  Service: General;  Laterality: Left;  needle localization at breast center of [place]   . BREAST SURGERY  2014   Left lumpectomy--benign mass  . broken foot Left 02/2015  . CESAREAN SECTION  1985  . CHOLECYSTECTOMY  1994  . EVALUATION UNDER ANESTHESIA WITH ANAL FISTULECTOMY  2011  . OOPHORECTOMY  2004   BSO--endometriosis  . THYROID LOBECTOMY  1990  .  TOTAL ABDOMINAL HYSTERECTOMY  04/20/2002   with left uterolysis/BSO  . TUBAL LIGATION  1991    There were no vitals filed for this visit.          [facility] PT Assessment - 04/11/16 0001      Assessment   Medical Diagnosis Low back pain   Referring Provider Dr. Wandra Feinstein    Onset Date/Surgical Date 04/09/15   Next MD Visit 05/05/16          [employer] Adult PT Treatment/Exercise - 04/11/16 0001      Lumbar Exercises: Stretches   Passive Hamstring Stretch 2 reps;30 seconds  supine with strap   Single Knee to Chest Stretch 2 reps;30 seconds   Lower Trunk Rotation 5 reps   Piriformis Stretch 2 reps;20 seconds     Lumbar Exercises: Aerobic   Stationary Bike NuStep L4: 5 min      Lumbar Exercises: Supine   Ab Set 5 reps;5 seconds   AB Set Limitations multiple tactile cues to properly initiate.    Bridge --     Knee/Hip Exercises: Clinical research associate Right;Left;2 reps;30 seconds   Press photographer Limitations (long sitting with strap)     Moist Heat Therapy   Number Minutes Moist Heat 15 Minutes   Moist Heat Location Lumbar Spine     Electrical Stimulation  Electrical Stimulation Location Lt glute and Lt lumbar paraspinals    Electrical Stimulation Action IFC   Electrical Stimulation Parameters to tolerance    Electrical Stimulation Goals Pain                PT Education - 04/11/16 1145    Education provided Yes   Education Details HEP (handout), and TENS application/use.   Person(s) Educated Patient   Methods Explanation;Handout;Demonstration   Comprehension Verbalized understanding             PT Long Term Goals - 04/11/16 1159      PT LONG TERM GOAL #1   Title I with advanced HEP to include a walking program ( 05/06/16)    Time 4   Period Weeks   Status On-going     PT LONG TERM GOAL #2   Title report Lt sciatica pain reduced =/> 75% with dailly activity ( 05/06/16)    Time 4   Period Weeks   Status On-going     PT LONG TERM  GOAL #3   Title demo Lt LE strength =/> 5-/5 through out to allow her to dance at home ( 05/06/16)    Time 4   Period Weeks   Status On-going     PT LONG TERM GOAL #4   Title improve FOTO =/< 51% limited ( 05/06/16)    Time 4   Period Weeks   Status On-going     PT LONG TERM GOAL #5   Title increase bilat ankle dorsiflexion =/> 12 degrees bilat to allow for normalized gait ( 05/06/16)    Time 4   Period Weeks   Status On-going               Plan - 04/11/16 1200    Clinical Impression Statement Pt required some tactile cues for TA engagement today.  Added a few additional hip/LE stretches to HEP.   Pt will benefit from continued PT intervention to progress towards established goals.    Rehab Potential Good   PT Frequency 2x / week   PT Duration 4 weeks   PT Treatment/Interventions Moist Heat;Traction;Ultrasound;Therapeutic exercise;Therapeutic activities;Dry needling;Taping;Manual techniques;Neuromuscular re-education;Cryotherapy;Electrical Stimulation;Patient/family education   PT Next Visit Plan Continued progressive LE stetching, core strengthening, manual work and modalities for pain   Consulted and Agree with Plan of Care Patient      Patient will benefit from skilled therapeutic intervention in order to improve the following deficits and impairments:  Decreased strength, Pain, Improper body mechanics, Decreased range of motion, Increased muscle spasms  Visit Diagnosis: Chronic left-sided low back pain with left-sided sciatica  Muscle weakness (generalized)  Other muscle spasm     Problem List Patient Active Problem List   Diagnosis Date Noted  . Moderate persistent asthma 10/29/2014  . Rhinitis 10/29/2014  . GERD (gastroesophageal reflux disease) 10/29/2014  . Intraductal papilloma of breast 05/01/2012   Kerin Perna, PTA 04/11/16 12:04 PM  [place], [place],  [address] Phone: [phone]   Fax:  [phone]  Name: [name] MRN: [id number] Date of Birth: [birth year]

## 2016-04-16 ENCOUNTER — Ambulatory Visit (INDEPENDENT_AMBULATORY_CARE_PROVIDER_SITE_OTHER): Payer: Commercial Managed Care - PPO | Admitting: Physical Therapy

## 2016-04-16 ENCOUNTER — Ambulatory Visit: Payer: Commercial Managed Care - PPO

## 2016-04-16 DIAGNOSIS — G8929 Other chronic pain: Secondary | ICD-10-CM

## 2016-04-16 DIAGNOSIS — E049 Nontoxic goiter, unspecified: Secondary | ICD-10-CM

## 2016-04-16 DIAGNOSIS — M5442 Lumbago with sciatica, left side: Secondary | ICD-10-CM | POA: Diagnosis not present

## 2016-04-16 DIAGNOSIS — E041 Nontoxic single thyroid nodule: Secondary | ICD-10-CM

## 2016-04-16 DIAGNOSIS — M6281 Muscle weakness (generalized): Secondary | ICD-10-CM

## 2016-04-16 DIAGNOSIS — M62838 Other muscle spasm: Secondary | ICD-10-CM | POA: Diagnosis not present

## 2016-04-16 NOTE — Therapy (Signed)
[place], [place], [address] Phone: [phone]   Fax:  [phone]  Physical Therapy Treatment  Patient Details  Name: [name] MRN: [id number] Date of Birth: [birth year] Referring Provider: Dr. Wandra Feinstein  Encounter Date: 04/16/2016      PT End of Session - 04/16/16 1148    Visit Number 3   Number of Visits 8   Date for PT Re-Evaluation 05/06/16   PT Start Time [id number]   PT Stop Time 1247   PT Time Calculation (min) 59 min   Activity Tolerance Patient tolerated treatment well      Past Medical History:  Diagnosis Date  . Abnormal Pap smear of cervix 1992   hx cryotherapy to cervix/also abn.pap 2004--Hyst  . Anemia    2015 to present  . Anxiety   . Arthritis    hands, legs  . Cavernous malformation    "indicative of brain lipoma"; is followed at [facility]  . Complication of anesthesia    hard to wake up post-op  . Endometriosis   . Fibroid   . GERD (gastroesophageal reflux disease)   . Headache(784.0)    migraines  . Hypothyroidism    Hx Rt.thyroid nodule--removed 1991 in [place]  . 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 Motion sickness    due to cavernous malformation  . Osteoporosis     Past Surgical History:  Procedure Laterality Date  . BREAST LUMPECTOMY WITH NEEDLE LOCALIZATION Left 05/27/2012   Procedure: BREAST LUMPECTOMY WITH NEEDLE LOCALIZATION;  Surgeon: Imogene Burn. Georgette Dover, MD;  Location: [place];  Service: General;  Laterality: Left;  needle localization at breast center of [place]   . BREAST SURGERY  2014   Left lumpectomy--benign mass  . broken foot Left 02/2015  . CESAREAN SECTION  1985  . CHOLECYSTECTOMY  1994  . EVALUATION UNDER ANESTHESIA WITH ANAL FISTULECTOMY  2011  . OOPHORECTOMY  2004   BSO--endometriosis  . THYROID LOBECTOMY  1990  . TOTAL ABDOMINAL HYSTERECTOMY  04/20/2002   with left uterolysis/BSO  . TUBAL LIGATION  1991     There were no vitals filed for this visit.      Subjective Assessment - 04/16/16 1151    Subjective Pt just had diagnostic ultrasound performed; was positioned awkward on table and now her back pain increased.  She received her TENS unit and has used it twice.  Her overall pain level has reduced since starting therapy.   She performs HEP 1x/day before bed.     Currently in Pain? Yes   Pain Score 4    Pain Location Back   Pain Orientation Left   Pain Descriptors / Indicators Dull;Nagging   Aggravating Factors  laying flat.    Pain Relieving Factors heat, medication, TENS            [facility] PT Assessment - 04/16/16 0001      Assessment   Medical Diagnosis Low back pain   Referring Provider Dr. Wandra Feinstein   Onset Date/Surgical Date 04/09/15   Next MD Visit 05/05/16     ROM / Strength   AROM / PROM / Strength AROM;PROM     AROM   Right/Left Ankle Left;Right   Right Ankle Dorsiflexion 10   Left Ankle Dorsiflexion 9     PROM   PROM Assessment Site Ankle   Right/Left Ankle Right;Left   Right Ankle Dorsiflexion 15   Left Ankle Dorsiflexion 13  [place] Adult PT Treatment/Exercise - 04/16/16 0001      Lumbar Exercises: Stretches   Passive Hamstring Stretch 2 reps;30 seconds  supine with strap   Single Knee to Chest Stretch 2 reps;30 seconds   Lower Trunk Rotation 5 reps     Lumbar Exercises: Seated   Hip Flexion on Ball Limitations Seated:  TA/oblique isometrics with PTA resistance Lt/Rt, and diagonals (pressure against hands at waist level) -x 40 reps    Sit to Stand 10 reps  VC for core engagement.      Moist Heat Therapy   Number Minutes Moist Heat 15 Minutes   Moist Heat Location Hip  (bilat glutes; low back)     Electrical Stimulation   Electrical Stimulation Location Lt glute and lateral hip   Electrical Stimulation Action IFC   Electrical Stimulation Parameters to tolerance    Electrical Stimulation Goals Pain     Manual Therapy    Manual Therapy Soft tissue mobilization   Manual therapy comments pt prone   Soft tissue mobilization STM to Lt glute and hip rotators; TPR to piriformis.                      PT Long Term Goals - 04/16/16 1155      PT LONG TERM GOAL #1   Title I with advanced HEP to include a walking program ( 05/06/16)    Time 4   Period Weeks   Status On-going     PT LONG TERM GOAL #2   Title report Lt sciatica pain reduced =/> 75% with dailly activity ( 05/06/16)    Time 4   Period Weeks   Status On-going  20% reduction 04/16/16     PT LONG TERM GOAL #3   Title demo Lt LE strength =/> 5-/5 through out to allow her to dance at home ( 05/06/16)    Time 4   Period Weeks   Status On-going     PT LONG TERM GOAL #4   Title improve FOTO =/< 51% limited ( 05/06/16)    Time 4   Period Weeks   Status On-going     PT LONG TERM GOAL #5   Title increase bilat ankle dorsiflexion =/> 12 degrees bilat to allow for normalized gait ( 05/06/16)    Time 4   Period Weeks   Status On-going  progressing                Plan - 04/16/16 1211    Clinical Impression Statement Pt demonstrated improved ankle DF ROM today; near meeting LTG #5.  She tolerated all exercises well without increase in pain. Improved core engagement in sitting today, compared to supine last 2 visits.  Pt reported reduction in pain after stretches and further reduction with use of estim/MHP.    Rehab Potential Good   PT Frequency 2x / week   PT Duration 4 weeks   PT Treatment/Interventions Moist Heat;Traction;Ultrasound;Therapeutic exercise;Therapeutic activities;Dry needling;Taping;Manual techniques;Neuromuscular re-education;Cryotherapy;Electrical Stimulation;Patient/family education   PT Next Visit Plan Continued progressive LE stetching, core strengthening, manual work and modalities for pain   Consulted and Agree with Plan of Care Patient      Patient will benefit from skilled therapeutic intervention in order  to improve the following deficits and impairments:  Decreased strength, Pain, Improper body mechanics, Decreased range of motion, Increased muscle spasms  Visit Diagnosis: Chronic left-sided low back pain with left-sided sciatica  Muscle weakness (generalized)  Other muscle spasm  Problem List Patient Active Problem List   Diagnosis Date Noted  . Moderate persistent asthma 10/29/2014  . Rhinitis 10/29/2014  . GERD (gastroesophageal reflux disease) 10/29/2014  . Intraductal papilloma of breast 05/01/2012   Kerin Perna, PTA 04/16/16 12:43 PM  [place], [place], [address] Phone: [phone]   Fax:  [phone]  Name: [name] MRN: [id number] Date of Birth: 1965-06-23

## 2016-04-19 ENCOUNTER — Encounter: Payer: Commercial Managed Care - PPO | Admitting: Physical Therapy

## 2016-04-22 ENCOUNTER — Ambulatory Visit (INDEPENDENT_AMBULATORY_CARE_PROVIDER_SITE_OTHER): Payer: Commercial Managed Care - PPO | Admitting: Physical Therapy

## 2016-04-22 DIAGNOSIS — M62838 Other muscle spasm: Secondary | ICD-10-CM

## 2016-04-22 DIAGNOSIS — G8929 Other chronic pain: Secondary | ICD-10-CM

## 2016-04-22 DIAGNOSIS — M6281 Muscle weakness (generalized): Secondary | ICD-10-CM

## 2016-04-22 DIAGNOSIS — M5442 Lumbago with sciatica, left side: Secondary | ICD-10-CM | POA: Diagnosis not present

## 2016-04-22 NOTE — Therapy (Signed)
[place], [place], [address] Phone: [phone]   Fax:  [phone]  Physical Therapy Treatment  Patient Details  Name: [name] MRN: [id number] Date of Birth: Aug 05, 1965 Referring Provider: Dr. Wandra Feinstein  Encounter Date: 04/22/2016      PT End of Session - 04/22/16 1157    Visit Number 4   Number of Visits 8   Date for PT Re-Evaluation 05/06/16   PT Start Time 1157  pt arrived late   PT Stop Time 1253   PT Time Calculation (min) 56 min   Activity Tolerance Patient tolerated treatment well   Behavior During Therapy [facility] for tasks assessed/performed      Past Medical History:  Diagnosis Date  . Abnormal Pap smear of cervix 1992   hx cryotherapy to cervix/also abn.pap 2004--Hyst  . Anemia    2015 to present  . Anxiety   . Arthritis    hands, legs  . Cavernous malformation    "indicative of brain lipoma"; is followed at [facility]  . Complication of anesthesia    hard to wake up post-op  . Endometriosis   . Fibroid   . GERD (gastroesophageal reflux disease)   . Headache(784.0)    migraines  . Hypothyroidism    Hx Rt.thyroid nodule--removed 1991 in [place]  . 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 Motion sickness    due to cavernous malformation  . Osteoporosis     Past Surgical History:  Procedure Laterality Date  . BREAST LUMPECTOMY WITH NEEDLE LOCALIZATION Left 05/27/2012   Procedure: BREAST LUMPECTOMY WITH NEEDLE LOCALIZATION;  Surgeon: Imogene Burn. Georgette Dover, MD;  Location: [place];  Service: General;  Laterality: Left;  needle localization at breast center of [place]   . BREAST SURGERY  2014   Left lumpectomy--benign mass  . broken foot Left 02/2015  . CESAREAN SECTION  1985  . CHOLECYSTECTOMY  1994  . EVALUATION UNDER ANESTHESIA WITH ANAL FISTULECTOMY  2011  . OOPHORECTOMY  2004   BSO--endometriosis  . THYROID LOBECTOMY  1990  . TOTAL ABDOMINAL  HYSTERECTOMY  04/20/2002   with left uterolysis/BSO  . TUBAL LIGATION  1991    There were no vitals filed for this visit.      Subjective Assessment - 04/22/16 1157    Subjective Pt reports pain being off and on. Yesterday she was doing yard work and took grandkids out, held Sport and exercise psychologist on Lt hip.  Last night she reports of putting a pillow underneath her leg to relieve pressure.     Currently in Pain? Yes   Pain Score 4    Pain Location Hip   Pain Orientation Left   Pain Descriptors / Indicators Aching   Pain Type Chronic pain   Pain Radiating Towards down middle of the leg   Aggravating Factors  sitting on hard chair, laying flat   Pain Relieving Factors TENS, stretches   Multiple Pain Sites Yes            [facility] PT Assessment - 04/22/16 0001      Assessment   Medical Diagnosis Low back pain   Referring Provider Dr. Wandra Feinstein   Onset Date/Surgical Date 04/09/15   Next MD Visit 05/05/16     PROM   Right/Left Ankle Right;Left   Right Ankle Dorsiflexion 15   Left Ankle Dorsiflexion 14     Palpation   Palpation comment tight and tender along Lt ITB/lateral quad,  glute med, piriformis.           [place] Adult PT Treatment/Exercise - 04/22/16 0001      Self-Care   Self-Care Posture   Posture Educated pt on proper posture with stand-up desk; pt verbalized understanding.      Lumbar Exercises: Stretches   Passive Hamstring Stretch 2 reps;30 seconds  supine with strap   Lower Trunk Rotation 5 reps   Piriformis Stretch 4 reps;30 seconds   Piriformis Stretch Limitations 2 reps in sitting, 2 in supine     Lumbar Exercises: Aerobic   Stationary Bike L5: 6.5 min      Knee/Hip Exercises: Stretches   Press photographer Right;Left;2 reps;30 seconds  standing, heel off stretch     Moist Heat Therapy   Number Minutes Moist Heat 15 Minutes  pt in Rt sidelying   Moist Heat Location Hip;Lumbar Spine  Lt side     Electrical Stimulation   Electrical Stimulation Location Lt  hip   Electrical Stimulation Action IFC   Electrical Stimulation Parameters to tolerance   Electrical Stimulation Goals Pain     Manual Therapy   Manual Therapy Soft tissue mobilization;Myofascial release   Soft tissue mobilization STM and TPR to Lt glute and hip rotators   Myofascial Release to Lt glute med, lateral quad.                      PT Long Term Goals - 04/16/16 1155      PT LONG TERM GOAL #1   Title I with advanced HEP to include a walking program ( 05/06/16)    Time 4   Period Weeks   Status On-going     PT LONG TERM GOAL #2   Title report Lt sciatica pain reduced =/> 75% with dailly activity ( 05/06/16)    Time 4   Period Weeks   Status On-going  20% reduction 04/16/16     PT LONG TERM GOAL #3   Title demo Lt LE strength =/> 5-/5 through out to allow her to dance at home ( 05/06/16)    Time 4   Period Weeks   Status On-going     PT LONG TERM GOAL #4   Title improve FOTO =/< 51% limited ( 05/06/16)    Time 4   Period Weeks   Status On-going     PT LONG TERM GOAL #5   Title increase bilat ankle dorsiflexion =/> 12 degrees bilat to allow for normalized gait ( 05/06/16)    Time 4   Period Weeks   Status On-going  progressing                Plan - 04/22/16 1248    Clinical Impression Statement Pt had difficulty tolerating hooklying position today due to increased pain.  She was point tender in Lt hip with manual therapy; slightly decreased afterTPR and MFR to area.  Pt reported further reduction of pain with use of estim/MHP in Rt sidelying.  Pt is progressing towards established goals.    Rehab Potential Good   PT Frequency 2x / week   PT Next Visit Plan Measure LE strength. Cont core strengthening and LE stretching/strengthening.    Consulted and Agree with Plan of Care Patient      Patient will benefit from skilled therapeutic intervention in order to improve the following deficits and impairments:  Decreased strength, Pain, Improper  body mechanics, Decreased range of motion, Increased muscle spasms  Visit Diagnosis:  Chronic left-sided low back pain with left-sided sciatica  Muscle weakness (generalized)  Other muscle spasm     Problem List Patient Active Problem List   Diagnosis Date Noted  . Moderate persistent asthma 10/29/2014  . Rhinitis 10/29/2014  . GERD (gastroesophageal reflux disease) 10/29/2014  . Intraductal papilloma of breast 05/01/2012   Kerin Perna, PTA 04/22/16 12:52 PM  [place], [place], [address] Phone: [phone]   Fax:  [phone]  Name: [name] MRN: [id number] Date of Birth: Dec 10, 1965

## 2016-04-25 ENCOUNTER — Ambulatory Visit (INDEPENDENT_AMBULATORY_CARE_PROVIDER_SITE_OTHER): Payer: Commercial Managed Care - PPO | Admitting: Physical Therapy

## 2016-04-25 DIAGNOSIS — G8929 Other chronic pain: Secondary | ICD-10-CM

## 2016-04-25 DIAGNOSIS — M62838 Other muscle spasm: Secondary | ICD-10-CM | POA: Diagnosis not present

## 2016-04-25 DIAGNOSIS — M5442 Lumbago with sciatica, left side: Secondary | ICD-10-CM | POA: Diagnosis not present

## 2016-04-25 DIAGNOSIS — M6281 Muscle weakness (generalized): Secondary | ICD-10-CM

## 2016-04-25 NOTE — Therapy (Signed)
[place], [place], [address] Phone: [phone]   Fax:  [phone]  Physical Therapy Treatment  Patient Details  Name: [name] MRN: [id number] Date of Birth: 04/17/1965 Referring Provider: Dr. Wandra Feinstein   Encounter Date: 04/25/2016      PT End of Session - 04/25/16 1158    Visit Number 5   Number of Visits 8   Date for PT Re-Evaluation 05/06/16   PT Start Time 1150  pt arrived late   PT Stop Time 1232   PT Time Calculation (min) 42 min   Activity Tolerance Patient tolerated treatment well;No increased pain   Behavior During Therapy WFL for tasks assessed/performed      Past Medical History:  Diagnosis Date  . Abnormal Pap smear of cervix 1992   hx cryotherapy to cervix/also abn.pap 2004--Hyst  . Anemia    2015 to present  . Anxiety   . Arthritis    hands, legs  . Cavernous malformation    "indicative of brain lipoma"; is followed at [facility]  . Complication of anesthesia    hard to wake up post-op  . Endometriosis   . Fibroid   . GERD (gastroesophageal reflux disease)   . Headache(784.0)    migraines  . Hypothyroidism    Hx Rt.thyroid nodule--removed 1991 in [place]  . 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 Motion sickness    due to cavernous malformation  . Osteoporosis     Past Surgical History:  Procedure Laterality Date  . BREAST LUMPECTOMY WITH NEEDLE LOCALIZATION Left 05/27/2012   Procedure: BREAST LUMPECTOMY WITH NEEDLE LOCALIZATION;  Surgeon: Imogene Burn. Georgette Dover, MD;  Location: [facility];  Service: General;  Laterality: Left;  needle localization at breast center of [place]   . BREAST SURGERY  2014   Left lumpectomy--benign mass  . broken foot Left 02/2015  . CESAREAN SECTION  1985  . CHOLECYSTECTOMY  1994  . EVALUATION UNDER ANESTHESIA WITH ANAL FISTULECTOMY  2011  . OOPHORECTOMY  2004   BSO--endometriosis  . THYROID LOBECTOMY  1990  .  TOTAL ABDOMINAL HYSTERECTOMY  04/20/2002   with left uterolysis/BSO  . TUBAL LIGATION  1991    There were no vitals filed for this visit.      Subjective Assessment - 04/25/16 1158    Subjective Pt reports she has been doing some ROM exercises with her Lt hip prior to getting up the morning and this has helped. She is able to stand for up to an hour with her work desk now.     Patient Stated Goals walk and dance without as much pain, camp and play with Grandchildren   Currently in Pain? No/denies   Pain Score 0-No pain            [facility] PT Assessment - 04/25/16 0001      Assessment   Medical Diagnosis Low back pain   Referring Provider Dr. Wandra Feinstein    Onset Date/Surgical Date 04/09/15   Next MD Visit to be scheduled.      Strength   Right/Left Hip Right;Left   Right Hip Flexion 5/5   Left Hip Flexion 4/5   Left Hip ABduction 4+/5           [facility] Adult PT Treatment/Exercise - 04/25/16 0001      Lumbar Exercises: Aerobic   Stationary Bike L5: 6 min      Lumbar Exercises: Standing  Heel Raises 15 reps   Heel Raises Limitations pt reported increased Lt back/hip pain at end; stopped and stretched   Other Standing Lumbar Exercises LLE fire hydrants x 5 reps; Lt/Rt hip ext x 10 reps;      Lumbar Exercises: Supine   Isometric Hip Flexion 10 reps;5 seconds  LLE only, TA engaged.      Knee/Hip Exercises: Stretches   Passive Hamstring Stretch Left;3 reps;30 seconds   Piriformis Stretch Left;3 reps;30 seconds   Gastroc Stretch Right;Left;2 reps;30 seconds  standing, heel off stretch     Knee/Hip Exercises: Seated   Clamshell with TheraBand Green  20 reps                     PT Long Term Goals - 04/16/16 1155      PT LONG TERM GOAL #1   Title I with advanced HEP to include a walking program ( 05/06/16)    Time 4   Period Weeks   Status On-going     PT LONG TERM GOAL #2   Title report Lt sciatica pain reduced =/> 75% with dailly activity (  05/06/16)    Time 4   Period Weeks   Status On-going  20% reduction 04/16/16     PT LONG TERM GOAL #3   Title demo Lt LE strength =/> 5-/5 through out to allow her to dance at home ( 05/06/16)    Time 4   Period Weeks   Status On-going     PT LONG TERM GOAL #4   Title improve FOTO =/< 51% limited ( 05/06/16)    Time 4   Period Weeks   Status On-going     PT LONG TERM GOAL #5   Title increase bilat ankle dorsiflexion =/> 12 degrees bilat to allow for normalized gait ( 05/06/16)    Time 4   Period Weeks   Status On-going  progressing                Plan - 04/25/16 1244    Clinical Impression Statement Pt demonstrated continued weakness in Lt hip flexion/abduction.  She had intermittent increase in Lt low back pain with heel raises and Lt hip ext; eased with seated LE stretches.  Pt was pain free at end of session; declined modalities.  Progressing well towards goals.    Rehab Potential Good   PT Frequency 2x / week   PT Duration 4 weeks   PT Treatment/Interventions Moist Heat;Traction;Ultrasound;Therapeutic exercise;Therapeutic activities;Dry needling;Taping;Manual techniques;Neuromuscular re-education;Cryotherapy;Electrical Stimulation;Patient/family education   PT Next Visit Plan  Cont core strengthening and LE stretching/strengthening.    Consulted and Agree with Plan of Care Patient      Patient will benefit from skilled therapeutic intervention in order to improve the following deficits and impairments:  Decreased strength, Pain, Improper body mechanics, Decreased range of motion, Increased muscle spasms  Visit Diagnosis: Chronic left-sided low back pain with left-sided sciatica  Muscle weakness (generalized)  Other muscle spasm     Problem List Patient Active Problem List   Diagnosis Date Noted  . Moderate persistent asthma 10/29/2014  . Rhinitis 10/29/2014  . GERD (gastroesophageal reflux disease) 10/29/2014  . Intraductal papilloma of breast 05/01/2012    Kerin Perna, PTA 04/25/16 12:50 PM  [place], [place], [address] Phone: [phone]   Fax:  [phone]  Name: [name] MRN: [id number] Date of Birth: February 03, 1966

## 2016-04-25 NOTE — Patient Instructions (Addendum)
FLEXION: Supine (Isometric)    Lie on back, feet flat. Draw left knee toward chest push gently with same side arm. Hold __5_ seconds. Complete _2__ sets of _10__ repetitions.  * Can do this in sitting, pressure up against desk.  Engage core and don't slump in the back.  Strengthening: Hip Abductor - Resisted    SITTING UP With band looped around both legs above knees, push thighs apart. Repeat __10__ times per set. Do __2-3__ sets per session. Do __1__ sessions per day.  [facility] [place],  [address]  [phone] (office) [phone] (fax)

## 2016-04-29 ENCOUNTER — Encounter: Payer: Commercial Managed Care - PPO | Admitting: Physical Therapy

## 2016-05-02 ENCOUNTER — Ambulatory Visit (INDEPENDENT_AMBULATORY_CARE_PROVIDER_SITE_OTHER): Payer: Commercial Managed Care - PPO | Admitting: Physical Therapy

## 2016-05-02 DIAGNOSIS — M6281 Muscle weakness (generalized): Secondary | ICD-10-CM | POA: Diagnosis not present

## 2016-05-02 DIAGNOSIS — M62838 Other muscle spasm: Secondary | ICD-10-CM

## 2016-05-02 DIAGNOSIS — G8929 Other chronic pain: Secondary | ICD-10-CM

## 2016-05-02 DIAGNOSIS — M5442 Lumbago with sciatica, left side: Secondary | ICD-10-CM

## 2016-05-02 NOTE — Therapy (Signed)
[place], [place], [address] Phone: [phone]   Fax:  [phone]  Physical Therapy Treatment  Patient Details  Name: [name] MRN: [id number] Date of Birth: July 20, 1965 Referring Provider: Dr. Wandra Feinstein  Encounter Date: 05/02/2016      PT End of Session - 05/02/16 1152    Visit Number 6   Number of Visits 8   Date for PT Re-Evaluation 05/06/16   PT Start Time 1150   PT Stop Time 1237   PT Time Calculation (min) 47 min   Activity Tolerance Patient limited by pain   Behavior During Therapy [facility] for tasks assessed/performed      Past Medical History:  Diagnosis Date  . Abnormal Pap smear of cervix 1992   hx cryotherapy to cervix/also abn.pap 2004--Hyst  . Anemia    2015 to present  . Anxiety   . Arthritis    hands, legs  . Cavernous malformation    "indicative of brain lipoma"; is followed at [facility]  . Complication of anesthesia    hard to wake up post-op  . Endometriosis   . Fibroid   . GERD (gastroesophageal reflux disease)   . Headache(784.0)    migraines  . Hypothyroidism    Hx Rt.thyroid nodule--removed 1991 in [place]  . 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 Motion sickness    due to cavernous malformation  . Osteoporosis     Past Surgical History:  Procedure Laterality Date  . BREAST LUMPECTOMY WITH NEEDLE LOCALIZATION Left 05/27/2012   Procedure: BREAST LUMPECTOMY WITH NEEDLE LOCALIZATION;  Surgeon: Imogene Burn. Georgette Dover, MD;  Location: [facility];  Service: General;  Laterality: Left;  needle localization at [facility] center of [place]   . BREAST SURGERY  2014   Left lumpectomy--benign mass  . broken foot Left 02/2015  . CESAREAN SECTION  1985  . CHOLECYSTECTOMY  1994  . EVALUATION UNDER ANESTHESIA WITH ANAL FISTULECTOMY  2011  . OOPHORECTOMY  2004   BSO--endometriosis  . THYROID LOBECTOMY  1990  . TOTAL ABDOMINAL HYSTERECTOMY  04/20/2002   with  left uterolysis/BSO  . TUBAL LIGATION  1991    There were no vitals filed for this visit.      Subjective Assessment - 05/02/16 1152    Subjective Pt [place] the beach (3 hrs each way[place] in one day this weekend.  She got out of car 2 times each way, didn't do a lot of walking.  Sunday her back pain was up to 10/10. She missed work on Monday and used heating pad all day.   She used TENS, did gentle hip ROM and her pain is easing off.    Diagnostic tests MRI - arthritis in bilat hips, Bone density - osteopenia   Patient Stated Goals walk and dance without as much pain, camp and play with Grandchildren   Currently in Pain? Yes   Pain Score 4    Pain Location Back   Pain Orientation Left   Pain Descriptors / Indicators Aching   Pain Radiating Towards down to Lt calf   Aggravating Factors  laying flat, sitting on hard chair.    Pain Relieving Factors TENS, stretches.             [facility] PT Assessment - 05/02/16 0001      Assessment   Medical Diagnosis Low back pain   Referring Provider Dr. Wandra Feinstein   Onset Date/Surgical Date 04/09/15  Next MD Visit to be scheduled.            [facility] Adult PT Treatment/Exercise - 05/02/16 0001      Lumbar Exercises: Stretches   Passive Hamstring Stretch 3 reps;30 seconds   Passive Hamstring Stretch Limitations 2 additional reps on LLE after manual therapy    Lower Trunk Rotation 5 reps  to tolerance   Piriformis Stretch 2 reps;30 seconds   Piriformis Stretch Limitations 2 additional reps on LLE after manual therapy.      Lumbar Exercises: Aerobic   Stationary Bike L4: 6 min      Lumbar Exercises: Sidelying   Other Sidelying Lumbar Exercises AROM with Lt hip (abd to ext) x 5 reps     Modalities   Modalities Electrical Stimulation;Moist Heat     Moist Heat Therapy   Number Minutes Moist Heat 15 Minutes   Moist Heat Location Lumbar Spine;Hip     Electrical Stimulation   Electrical Stimulation Location Lt hamstring/glute,  Rt glute/ Rt QL   Electrical Stimulation Action premod to each area   Electrical Stimulation Parameters  to tolerance   Electrical Stimulation Goals Pain     Manual Therapy   Manual Therapy Soft tissue mobilization;Myofascial release   Manual therapy comments pt prone   Soft tissue mobilization STM to bilat glutes, lumbar paraspinals, Lt hamstring, Lt calf.     Myofascial Release MFR to bilat QL.                       PT Long Term Goals - 05/02/16 1231      PT LONG TERM GOAL #1   Title I with advanced HEP to include a walking program ( 05/06/16)    Time 4   Period Weeks   Status On-going     PT LONG TERM GOAL #2   Title report Lt sciatica pain reduced =/> 75% with dailly activity ( 05/06/16)    Time 4   Period Weeks   Status On-going     PT LONG TERM GOAL #3   Title demo Lt LE strength =/> 5-/5 through out to allow her to dance at home ( 05/06/16)    Time 4   Period Weeks   Status On-going     PT LONG TERM GOAL #4   Title improve FOTO =/< 51% limited ( 05/06/16)    Time 4   Period Weeks   Status On-going     PT LONG TERM GOAL #5   Title increase bilat ankle dorsiflexion =/> 12 degrees bilat to allow for normalized gait ( 05/06/16)    Time 4   Period Weeks   Status On-going  progressing               Plan - 05/02/16 1225    Clinical Impression Statement Pt has had recent flare up after long car trip to beach.  She was point tender in Rt low back/glute, as well as Lt glute and hamstring.  Somewhat guarded with manual therapy. Limited tolerance to exercise due to increased pain.  Pain reduced with hamstring stretches and use of MHP/estim at end of session.     Rehab Potential Good   PT Frequency 2x / week   PT Duration 4 weeks   PT Treatment/Interventions Moist Heat;Traction;Ultrasound;Therapeutic exercise;Therapeutic activities;Dry needling;Taping;Manual techniques;Neuromuscular re-education;Cryotherapy;Electrical Stimulation;Patient/family education    PT Next Visit Plan Assess goals, POC ends on 3/19.   FOTO.   Consulted and Agree with Plan of  Care Patient      Patient will benefit from skilled therapeutic intervention in order to improve the following deficits and impairments:  Decreased strength, Pain, Improper body mechanics, Decreased range of motion, Increased muscle spasms  Visit Diagnosis: Chronic left-sided low back pain with left-sided sciatica  Muscle weakness (generalized)  Other muscle spasm     Problem List Patient Active Problem List   Diagnosis Date Noted  . Moderate persistent asthma 10/29/2014  . Rhinitis 10/29/2014  . GERD (gastroesophageal reflux disease) 10/29/2014  . Intraductal papilloma of breast 05/01/2012   Kerin Perna, PTA 05/02/16 12:32 PM  [place], [place], [address] Phone: [phone]   Fax:  [phone]  Name: [name] MRN: [id number] Date of Birth: 07/28/1965

## 2016-05-07 ENCOUNTER — Ambulatory Visit (INDEPENDENT_AMBULATORY_CARE_PROVIDER_SITE_OTHER): Payer: Commercial Managed Care - PPO | Admitting: Physical Therapy

## 2016-05-07 DIAGNOSIS — M5442 Lumbago with sciatica, left side: Secondary | ICD-10-CM | POA: Diagnosis not present

## 2016-05-07 DIAGNOSIS — G8929 Other chronic pain: Secondary | ICD-10-CM

## 2016-05-07 DIAGNOSIS — M62838 Other muscle spasm: Secondary | ICD-10-CM | POA: Diagnosis not present

## 2016-05-07 DIAGNOSIS — M6281 Muscle weakness (generalized): Secondary | ICD-10-CM | POA: Diagnosis not present

## 2016-05-07 NOTE — Therapy (Addendum)
[place], [place], [address] Phone: [phone]   Fax:  [phone]  Physical Therapy Treatment  Patient Details  Name: [name] MRN: [id number] Date of Birth: 1966-01-19 Referring Provider: Dr. Wandra Feinstein  Encounter Date: 05/07/2016      PT End of Session - 05/07/16 1157    Visit Number 7   Number of Visits 8   Date for PT Re-Evaluation 05/06/16   PT Start Time 1597   PT Stop Time 1247   PT Time Calculation (min) 59 min   Activity Tolerance Patient tolerated treatment well   Behavior During Therapy [name] for tasks assessed/performed      Past Medical History:  Diagnosis Date  . Abnormal Pap smear of cervix 1992   hx cryotherapy to cervix/also abn.pap 2004--Hyst  . Anemia    2015 to present  . Anxiety   . Arthritis    hands, legs  . Cavernous malformation    "indicative of brain lipoma"; is followed at [facility]  . Complication of anesthesia    hard to wake up post-op  . Endometriosis   . Fibroid   . GERD (gastroesophageal reflux disease)   . Headache(784.0)    migraines  . Hypothyroidism    Hx Rt.thyroid nodule--removed 1991 in [place]  . 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 Motion sickness    due to cavernous malformation  . Osteoporosis     Past Surgical History:  Procedure Laterality Date  . BREAST LUMPECTOMY WITH NEEDLE LOCALIZATION Left 05/27/2012   Procedure: BREAST LUMPECTOMY WITH NEEDLE LOCALIZATION;  Surgeon: Imogene Burn. Georgette Dover, MD;  Location: [facility];  Service: General;  Laterality: Left;  needle localization at [facility] center of [place]   . BREAST SURGERY  2014   Left lumpectomy--benign mass  . broken foot Left 02/2015  . CESAREAN SECTION  1985  . CHOLECYSTECTOMY  1994  . EVALUATION UNDER ANESTHESIA WITH ANAL FISTULECTOMY  2011  . OOPHORECTOMY  2004   BSO--endometriosis  . THYROID LOBECTOMY  1990  . TOTAL ABDOMINAL HYSTERECTOMY  04/20/2002    with left uterolysis/BSO  . TUBAL LIGATION  1991    There were no vitals filed for this visit.      Subjective Assessment - 05/07/16 1157    Subjective [name] reports she has recovered from most recent flare up.  She reports 70% improvement since starting therapy.  But she continues to have persistant pain in low back with prolonged standing or laying flat on back.    How long can you sit comfortably? can tolerate 1- 1.5 hr at work    How long can you stand comfortably? 30 minutes without increase in pain.    Patient Stated Goals walk and dance without as much pain, camp and play with Grandchildren   Currently in Pain? Yes   Pain Score 3    Pain Location Back   Pain Orientation Left;Right;Lower   Pain Descriptors / Indicators Aching;Dull   Pain Radiating Towards down to Lt calf   Aggravating Factors  laying flat, sitting on hard chair   Pain Relieving Factors TENS, stretches.             [facility] PT Assessment - 05/07/16 0001      Assessment   Medical Diagnosis Low back pain   Referring Provider Dr. Wandra Feinstein   Onset Date/Surgical Date 04/09/15   Next MD Visit to be scheduled.  Observation/Other Assessments   Focus on Therapeutic Outcomes (FOTO)  49% limited.      AROM   Right Ankle Dorsiflexion 10   Left Ankle Dorsiflexion 9     PROM   Right Ankle Dorsiflexion 15   Left Ankle Dorsiflexion 13     Strength   Right Hip Flexion 5/5   Right Hip Extension --  5-/5   Right Hip ABduction 4+/5   Left Hip Flexion 4+/5   Left Hip Extension --  5-/5   Left Hip ABduction 4/5                     [facility] Adult PT Treatment/Exercise - 05/07/16 0001      Lumbar Exercises: Stretches   Passive Hamstring Stretch 3 reps;30 seconds   Lower Trunk Rotation 5 reps  to tolerance   Piriformis Stretch 2 reps;30 seconds     Lumbar Exercises: Aerobic   Stationary Bike L5: 6.5 min      Lumbar Exercises: Sidelying   Hip Abduction 10 reps;2 seconds  LLE    Other Sidelying Lumbar Exercises AROM with Lt hip (abd to ext) x 5 reps     Lumbar Exercises: Prone   Opposite Arm/Leg Raise Left arm/Right leg;Right arm/Left leg;10 reps   Opposite Arm/Leg Raise Limitations challenging     Knee/Hip Exercises: Stretches   Gastroc Stretch Right;Left;2 reps;30 seconds     Moist Heat Therapy   Number Minutes Moist Heat 15 Minutes   Moist Heat Location Lumbar Spine;Hip  Lt     Acupuncturist Location Lt hip    Electrical Stimulation Action IFC   Electrical Stimulation Parameters to tolerance      Manual Therapy   Manual therapy comments pt sidelying   Soft tissue mobilization STM to Lt glute, hip rotators, QL, ITB.                  PT Education - 05/07/16 1242    Education provided Yes   Education Details Dry needling.    Person(s) Educated Patient   Methods Explanation   Comprehension Verbalized understanding             PT Long Term Goals - 05/07/16 1208      PT LONG TERM GOAL #1   Title I with advanced HEP to include a walking program ( 05/06/16)    Time 4   Period Weeks   Status On-going     PT LONG TERM GOAL #2   Title report Lt sciatica pain reduced =/> 75% with dailly activity ( 05/06/16)    Period Weeks   Status On-going  70% reduction     PT LONG TERM GOAL #3   Title demo Lt LE strength =/> 5-/5 through out to allow her to dance at home ( 05/06/16)    Time 4   Period Weeks   Status Partially Met     PT LONG TERM GOAL #4   Title improve FOTO =/< 51% limited ( 05/06/16)    Time 4   Period Weeks   Status On-going     PT LONG TERM GOAL #5   Title increase bilat ankle dorsiflexion =/> 12 degrees bilat to allow for normalized gait ( 05/06/16)    Time 4   Period Weeks   Status Achieved               Plan - 05/07/16 1238    Clinical Impression Statement Pt demonstrating improved  LE strength; has partially met LTG #3.  Her overall pain has reduced by 70%, making good  gains towards LTG #5.  She has continued tenderness, and tightness in Lt hip; reduced with stretches, manual therapy and estim.  Pt is progressing towards remaining goals and will benefit from continued PT intervention.     Rehab Potential Good   PT Frequency 2x / week   PT Duration 4 weeks   PT Treatment/Interventions Moist Heat;Traction;Ultrasound;Therapeutic exercise;Therapeutic activities;Dry needling;Taping;Manual techniques;Neuromuscular re-education;Cryotherapy;Electrical Stimulation;Patient/family education   PT Next Visit Plan Spoke to supervising PT; will request additional visits.  Next visit dry needling.       Patient will benefit from skilled therapeutic intervention in order to improve the following deficits and impairments:  Decreased strength, Pain, Improper body mechanics, Decreased range of motion, Increased muscle spasms  Visit Diagnosis: Chronic left-sided low back pain with left-sided sciatica  Muscle weakness (generalized)  Other muscle spasm     Problem List Patient Active Problem List   Diagnosis Date Noted  . Moderate persistent asthma 10/29/2014  . Rhinitis 10/29/2014  . GERD (gastroesophageal reflux disease) 10/29/2014  . Intraductal papilloma of breast 05/01/2012    [name] 05/07/2016, 1:00 PM  [name] PT, MPH 05/07/16 1:14 PM   [facility] [place] [facility] [place], [place], [address] Phone: [phone]   Fax:  [phone]  Name: [name] MRN: [id number] Date of Birth: 19-May-1965

## 2016-05-07 NOTE — Addendum Note (Signed)
Addended by: Everardo All on: 05/07/2016 01:15 PM   Modules accepted: Orders

## 2016-05-15 ENCOUNTER — Ambulatory Visit (INDEPENDENT_AMBULATORY_CARE_PROVIDER_SITE_OTHER): Payer: Commercial Managed Care - PPO | Admitting: Physical Therapy

## 2016-05-15 ENCOUNTER — Encounter: Payer: Self-pay | Admitting: Physical Therapy

## 2016-05-15 DIAGNOSIS — G8929 Other chronic pain: Secondary | ICD-10-CM

## 2016-05-15 DIAGNOSIS — M62838 Other muscle spasm: Secondary | ICD-10-CM | POA: Diagnosis not present

## 2016-05-15 DIAGNOSIS — M5442 Lumbago with sciatica, left side: Secondary | ICD-10-CM

## 2016-05-15 DIAGNOSIS — M6281 Muscle weakness (generalized): Secondary | ICD-10-CM | POA: Diagnosis not present

## 2016-05-15 NOTE — Therapy (Signed)
[place], [place], [address] Phone: [phone]   Fax:  [phone]  Physical Therapy Treatment  Patient Details  Name: [name] MRN: [id number] Date of Birth: 07/28/1965 Referring Provider: Dr. Wandra Feinstein  Encounter Date: 05/15/2016      PT End of Session - 05/15/16 1103    Visit Number 8   Number of Visits 20   Date for PT Re-Evaluation 06/18/16   PT Start Time 1104   PT Stop Time (P)  1201   PT Time Calculation (min) (P)  57 min      Past Medical History:  Diagnosis Date  . Abnormal Pap smear of cervix 1992   hx cryotherapy to cervix/also abn.pap 2004--Hyst  . Anemia    2015 to present  . Anxiety   . Arthritis    hands, legs  . Cavernous malformation    "indicative of brain lipoma"; is followed at [facility]  . Complication of anesthesia    hard to wake up post-op  . Endometriosis   . Fibroid   . GERD (gastroesophageal reflux disease)   . Headache(784.0)    migraines  . Hypothyroidism    Hx Rt.thyroid nodule--removed 1991 in [place]  . 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 Motion sickness    due to cavernous malformation  . Osteoporosis     Past Surgical History:  Procedure Laterality Date  . BREAST LUMPECTOMY WITH NEEDLE LOCALIZATION Left 05/27/2012   Procedure: BREAST LUMPECTOMY WITH NEEDLE LOCALIZATION;  Surgeon: Imogene Burn. Georgette Dover, MD;  Location: [place];  Service: General;  Laterality: Left;  needle localization at breast center of [place]   . BREAST SURGERY  2014   Left lumpectomy--benign mass  . broken foot Left 02/2015  . CESAREAN SECTION  1985  . CHOLECYSTECTOMY  1994  . EVALUATION UNDER ANESTHESIA WITH ANAL FISTULECTOMY  2011  . OOPHORECTOMY  2004   BSO--endometriosis  . THYROID LOBECTOMY  1990  . TOTAL ABDOMINAL HYSTERECTOMY  04/20/2002   with left uterolysis/BSO  . TUBAL LIGATION  1991    There were no vitals filed for this  visit.      Subjective Assessment - 05/15/16 1106    Subjective [name] reports her Rt knee is really bothering her and she is going to go to the MD to have it checked out.    Patient Stated Goals walk and dance without as much pain, camp and play with Grandchildren   Currently in Pain? Yes   Pain Score 3    Pain Location Back   Pain Orientation Lower;Left   Pain Descriptors / Indicators Aching;Dull   Pain Type Chronic pain   Pain Onset More than a month ago   Pain Frequency Constant   Aggravating Factors  laying flat, transitioning up/down from supine/sit to stand.    Pain Relieving Factors TENS   Multiple Pain Sites Yes   Pain Score 6   Pain Location Knee   Pain Orientation Right   Pain Descriptors / Indicators Throbbing;Tightness   Pain Type Acute pain   Pain Onset 1 to 4 weeks ago   Pain Frequency Constant   Aggravating Factors  being still   Pain Relieving Factors injection and getting the leg moving                         [facility] Adult PT Treatment/Exercise - 05/15/16 0001  Exercises   Exercises Lumbar;Knee/Hip     Lumbar Exercises: Aerobic   Stationary Bike L5: 6 min      Lumbar Exercises: Supine   Bridge 20 reps  with clam, green band around knees   Straight Leg Raise 20 reps  with slight ER each side     Modalities   Modalities Electrical Stimulation;Moist Heat     Moist Heat Therapy   Number Minutes Moist Heat 15 Minutes   Moist Heat Location Lumbar Spine  and gluts     Electrical Stimulation   Electrical Stimulation Location Bilat gluts   Electrical Stimulation Action IFC to tolerance   Electrical Stimulation Goals Tone;Pain     Manual Therapy   Manual therapy comments prone   Soft tissue mobilization STM to Lt glute, hip rotators, QL, ITB.    and Rt gluts          Trigger Point Dry Needling - 05/15/16 1138    Consent Given? Yes   Education Handout Provided Yes   Muscles Treated Upper Body Quadratus Lumborum  LT with  good response   Muscles Treated Lower Body Gluteus minimus;Gluteus maximus   Gluteus Maximus Response Palpable increased muscle length;Twitch response elicited  bilat   Gluteus Minimus Response Palpable increased muscle length;Twitch response elicited  bilat                   PT Long Term Goals - 05/07/16 1208      PT LONG TERM GOAL #1   Title I with advanced HEP to include a walking program ( 06/18/16)    Time 10   Period Weeks   Status Revised     PT LONG TERM GOAL #2   Title report Lt sciatica pain reduced =/> 75% with dailly activity ( 06/18/16)    Time 10   Period Weeks   Status Revised  70% reduction     PT LONG TERM GOAL #3   Title demo Lt LE strength =/> 5-/5 through out to allow her to dance at home ( 06/18/16)    Time 10   Period Weeks   Status Revised     PT LONG TERM GOAL #4   Title improve FOTO =/< 51% limited (06/18/16)    Time 10   Period Weeks   Status Revised     PT LONG TERM GOAL #5   Title patient to report abilty to stand for >30 min without incresaed LBP 06/18/16   Time 6   Period Weeks   Status New               Plan - 05/15/16 1325    Clinical Impression Statement [name] is having more pain with her Rt knee now than her back.  This is most likely due to changes in gait from back pain.  She had good twitch responses with DN and less palpable tenderness with manual work after this.  No new goals have been met.  She is continuing with her HEP as able with her knee pain.    Rehab Potential Good   PT Frequency 2x / week   PT Duration 4 weeks   PT Treatment/Interventions Moist Heat;Traction;Ultrasound;Therapeutic exercise;Therapeutic activities;Dry needling;Taping;Manual techniques;Neuromuscular re-education;Cryotherapy;Electrical Stimulation;Patient/family education   PT Next Visit Plan assess response to DN and manual work.  REaddress as needed and progress core and quad work.    Consulted and Agree with Plan of Care Patient      Patient  will benefit from skilled therapeutic  intervention in order to improve the following deficits and impairments:  Decreased strength, Pain, Improper body mechanics, Decreased range of motion, Increased muscle spasms  Visit Diagnosis: Chronic left-sided low back pain with left-sided sciatica  Muscle weakness (generalized)  Other muscle spasm     Problem List Patient Active Problem List   Diagnosis Date Noted  . Moderate persistent asthma 10/29/2014  . Rhinitis 10/29/2014  . GERD (gastroesophageal reflux disease) 10/29/2014  . Intraductal papilloma of breast 05/01/2012    Jeral Pinch PT  05/15/2016, 1:28 PM  [facility] [place], [place], [address] Phone: [phone]   Fax:  [phone]  Name: [name] MRN: [id number] Date of Birth: [birth year]

## 2016-05-27 ENCOUNTER — Ambulatory Visit (INDEPENDENT_AMBULATORY_CARE_PROVIDER_SITE_OTHER): Payer: Commercial Managed Care - PPO | Admitting: Physical Therapy

## 2016-05-27 DIAGNOSIS — G8929 Other chronic pain: Secondary | ICD-10-CM

## 2016-05-27 DIAGNOSIS — M62838 Other muscle spasm: Secondary | ICD-10-CM

## 2016-05-27 DIAGNOSIS — M5442 Lumbago with sciatica, left side: Secondary | ICD-10-CM | POA: Diagnosis not present

## 2016-05-27 DIAGNOSIS — M6281 Muscle weakness (generalized): Secondary | ICD-10-CM

## 2016-05-27 NOTE — Therapy (Signed)
[place], [place], [address] Phone: [phone]   Fax:  [phone]  Physical Therapy Treatment  Patient Details  Name: [name] MRN: [id number] Date of Birth: Jun 10, 1965 Referring Provider: Dr. Wandra Feinstein  Encounter Date: 05/27/2016      PT End of Session - 05/27/16 1201    Visit Number 9   Number of Visits 20   Date for PT Re-Evaluation 06/18/16   PT Start Time 8546  pt arrived late   PT Stop Time 1243   PT Time Calculation (min) 50 min      Past Medical History:  Diagnosis Date  . Abnormal Pap smear of cervix 1992   hx cryotherapy to cervix/also abn.pap 2004--Hyst  . Anemia    2015 to present  . Anxiety   . Arthritis    hands, legs  . Cavernous malformation    "indicative of brain lipoma"; is followed at [facility]  . Complication of anesthesia    hard to wake up post-op  . Endometriosis   . Fibroid   . GERD (gastroesophageal reflux disease)   . Headache(784.0)    migraines  . Hypothyroidism    Hx Rt.thyroid nodule--removed 1991 in [place]  . 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 Motion sickness    due to cavernous malformation  . Osteoporosis     Past Surgical History:  Procedure Laterality Date  . BREAST LUMPECTOMY WITH NEEDLE LOCALIZATION Left 05/27/2012   Procedure: BREAST LUMPECTOMY WITH NEEDLE LOCALIZATION;  Surgeon: Imogene Burn. Georgette Dover, MD;  Location: [place];  Service: General;  Laterality: Left;  needle localization at breast center of [place]   . BREAST SURGERY  2014   Left lumpectomy--benign mass  . broken foot Left 02/2015  . CESAREAN SECTION  1985  . CHOLECYSTECTOMY  1994  . EVALUATION UNDER ANESTHESIA WITH ANAL FISTULECTOMY  2011  . OOPHORECTOMY  2004   BSO--endometriosis  . THYROID LOBECTOMY  1990  . TOTAL ABDOMINAL HYSTERECTOMY  04/20/2002   with left uterolysis/BSO  . TUBAL LIGATION  1991    There were no vitals filed for this  visit.      Subjective Assessment - 05/27/16 1202    Subjective Pt reports her Rt knee is still bothering her.  She had an injection in it last Wednesday; MD wanted her to not do a whole lot to disturb knee.  She was able to tolerate a drive to [place] without much difficulty with her back.  She had positive experience with dry needling 2 wks ago.     Pertinent History osteopenia in lumbar, pain and inflammation in her knees.     Currently in Pain? Yes   Pain Score 4    Pain Location Knee   Pain Orientation Right   Pain Descriptors / Indicators Dull;Aching   Aggravating Factors  stairs, bending knee   Pain Relieving Factors ice, elevation    Multiple Pain Sites Yes   Pain Score 0   Pain Location Back            [facility] PT Assessment - 05/27/16 0001      Assessment   Medical Diagnosis Low back pain   Referring Provider Dr. Wandra Feinstein   Onset Date/Surgical Date 04/09/15   Next MD Visit to be scheduled.      Strength   Right Hip Flexion 5/5   Right Hip Extension --  5-/5   Right Hip ABduction --  5-/5   Left Hip Flexion 5/5   Left Hip Extension --  5-/5   Left Hip ABduction --  5-/5          [facility] Adult PT Treatment/Exercise - 05/27/16 0001      Lumbar Exercises: Stretches   Passive Hamstring Stretch 3 reps;30 seconds   Lower Trunk Rotation 5 reps  to tolerance   Quad Stretch 3 reps;30 seconds   Piriformis Stretch 2 reps;30 seconds     Lumbar Exercises: Supine   Bridge 20 reps     Manual Therapy   Manual Therapy Taping   Manual therapy comments prone   Soft tissue mobilization STM to Lt glute, hip rotators, QL, ITB.    and Rt gluts   Kinesiotex Facilitate Muscle     Kinesiotix   Facilitate Muscle  2 -I strips of Rock tape applied to Rt anterion knee in J shape surrounding patella to decrease pain and increase proprioception.                 PT Education - 05/27/16 1242    Education provided Yes   Education Details Rationale for  [employer], including instructions for safe removal.    Person(s) Educated Patient   Methods Explanation   Comprehension Verbalized understanding             PT Long Term Goals - 05/27/16 1206      PT LONG TERM GOAL #1   Title I with advanced HEP to include a walking program ( 06/18/16)    Time 10   Period Weeks   Status On-going  limited due to knee pain     PT LONG TERM GOAL #2   Title report Lt sciatica pain reduced =/> 75% with dailly activity ( 06/18/16)    Time 10   Period Weeks   Status Achieved     PT LONG TERM GOAL #3   Title demo Lt LE strength =/> 5-/5 through out to allow her to dance at home ( 06/18/16)    Time 10   Period Weeks   Status Partially Met     PT LONG TERM GOAL #4   Title improve FOTO =/< 51% limited (06/18/16)    Time 10   Period Weeks   Status On-going     PT LONG TERM GOAL #5   Title patient to report abilty to stand for >30 min without incresaed LBP 06/18/16   Time 6   Period Weeks   Status On-going  unable to tolerate due to knee pain               Plan - 05/27/16 1238    Clinical Impression Statement Pt's hip strength has improved since last assessment.  She is having less back/Lt hip pain on a daily basis, however she continues to be very point tender to the piriformis area with manual therapy.   Pain reduced with use of estim/ MHP at end of session.   Exercise limited per pt request, since Rt knee is painful.  She has met LTG#2 and partially met LTG#3.     Rehab Potential Good   PT Frequency 2x / week   PT Duration 4 weeks   PT Treatment/Interventions Moist Heat;Traction;Ultrasound;Therapeutic exercise;Therapeutic activities;Dry needling;Taping;Manual techniques;Neuromuscular re-education;Cryotherapy;Electrical Stimulation;Patient/family education   PT Next Visit Plan Dry needling/ manual therapy. Assess response to Rock tape.     Consulted and Agree with Plan of Care Patient      Patient will benefit  from skilled  therapeutic intervention in order to improve the following deficits and impairments:  Decreased strength, Pain, Improper body mechanics, Decreased range of motion, Increased muscle spasms  Visit Diagnosis: Chronic left-sided low back pain with left-sided sciatica  Muscle weakness (generalized)  Other muscle spasm     Problem List Patient Active Problem List   Diagnosis Date Noted  . Moderate persistent asthma 10/29/2014  . Rhinitis 10/29/2014  . GERD (gastroesophageal reflux disease) 10/29/2014  . Intraductal papilloma of breast 05/01/2012   Kerin Perna, PTA 05/27/16 12:57 PM  [place], [place], [address] Phone: [phone]   Fax:  [phone]  Name: [name] MRN: [id number] Date of Birth: Apr 14, 1965

## 2016-06-03 ENCOUNTER — Ambulatory Visit (INDEPENDENT_AMBULATORY_CARE_PROVIDER_SITE_OTHER): Payer: Commercial Managed Care - PPO | Admitting: Physical Therapy

## 2016-06-03 DIAGNOSIS — M5442 Lumbago with sciatica, left side: Secondary | ICD-10-CM | POA: Diagnosis not present

## 2016-06-03 DIAGNOSIS — G8929 Other chronic pain: Secondary | ICD-10-CM | POA: Diagnosis not present

## 2016-06-03 DIAGNOSIS — M62838 Other muscle spasm: Secondary | ICD-10-CM

## 2016-06-03 DIAGNOSIS — M6281 Muscle weakness (generalized): Secondary | ICD-10-CM

## 2016-06-03 NOTE — Therapy (Addendum)
[place], [place], [address] Phone: [phone]   Fax:  [phone]  Physical Therapy Treatment  Patient Details  Name: [name] MRN: [id number] Date of Birth: 08/12/1965 Referring Provider: Dr. Wandra Feinstein  Encounter Date: 06/03/2016      PT End of Session - 06/03/16 1155    Visit Number 10   Number of Visits 20   Date for PT Re-Evaluation 06/18/16   PT Start Time 0301   PT Stop Time 1243   PT Time Calculation (min) 50 min   Activity Tolerance Patient tolerated treatment well      Past Medical History:  Diagnosis Date  . Abnormal Pap smear of cervix 1992   hx cryotherapy to cervix/also abn.pap 2004--Hyst  . Anemia    2015 to present  . Anxiety   . Arthritis    hands, legs  . Cavernous malformation    "indicative of brain lipoma"; is followed at [facility]  . Complication of anesthesia    hard to wake up post-op  . Endometriosis   . Fibroid   . GERD (gastroesophageal reflux disease)   . Headache(784.0)    migraines  . Hypothyroidism    Hx Rt.thyroid nodule--removed 1991 in [place]  . 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 Motion sickness    due to cavernous malformation  . Osteoporosis     Past Surgical History:  Procedure Laterality Date  . BREAST LUMPECTOMY WITH NEEDLE LOCALIZATION Left 05/27/2012   Procedure: BREAST LUMPECTOMY WITH NEEDLE LOCALIZATION;  Surgeon: Imogene Burn. Georgette Dover, MD;  Location: [place];  Service: General;  Laterality: Left;  needle localization at breast center of [place]   . BREAST SURGERY  2014   Left lumpectomy--benign mass  . broken foot Left 02/2015  . CESAREAN SECTION  1985  . CHOLECYSTECTOMY  1994  . EVALUATION UNDER ANESTHESIA WITH ANAL FISTULECTOMY  2011  . OOPHORECTOMY  2004   BSO--endometriosis  . THYROID LOBECTOMY  1990  . TOTAL ABDOMINAL HYSTERECTOMY  04/20/2002   with left uterolysis/BSO  . TUBAL LIGATION  1991     There were no vitals filed for this visit.      Subjective Assessment - 06/03/16 1152    Subjective Pt went camping this past weekend, sleeping in camper. Moved around more and did a lot of walking   Patient Stated Goals walk and dance without as much pain, camp and play with Grandchildren   Currently in Pain? Yes   Pain Score 5    Pain Location Back   Pain Orientation Left   Pain Descriptors / Indicators Sharp   Pain Type Chronic pain   Pain Onset More than a month ago   Pain Frequency Constant   Aggravating Factors  stairs and bending the knee, walking    Pain Relieving Factors ice and elevation                         [facility] Adult PT Treatment/Exercise - 06/03/16 0001      Lumbar Exercises: Stretches   Single Knee to Chest Stretch 2 reps;20 seconds  each side   Piriformis Stretch 2 reps;30 seconds     Lumbar Exercises: Seated   LAQ on Chair Limitations 3x10 bilat LE SLR with ER in long sit.      Lumbar Exercises: Supine   Bridge 5 reps  stopped due to increased back pain.  Modalities   Modalities Electrical Stimulation;Moist Heat     Moist Heat Therapy   Number Minutes Moist Heat 15 Minutes   Moist Heat Location --  buttocks, Rt TFL     Electrical Stimulation   Electrical Stimulation Location bilat gluts    Electrical Stimulation Action IFC   Electrical Stimulation Parameters totolerance   Electrical Stimulation Goals Pain;Tone     Manual Therapy   Manual Therapy Soft tissue mobilization   Soft tissue mobilization STM to bilat gluts          Trigger Point Dry Needling - 06/03/16 1157    Consent Given? Yes   Education Handout Provided No   Muscles Treated Lower Body Gluteus maximus   Gluteus Maximus Response Palpable increased muscle length;Twitch response elicited  bilat with stim                   PT Long Term Goals - 06/03/16 1231      PT LONG TERM GOAL #1   Title I with advanced HEP to include a walking  program ( 06/18/16)    Status On-going     PT LONG TERM GOAL #2   Title report Lt sciatica pain reduced =/> 75% with dailly activity ( 06/18/16)    Status Achieved     PT LONG TERM GOAL #3   Title demo Lt LE strength =/> 5-/5 through out to allow her to dance at home ( 06/18/16)    Status Partially Met     PT LONG TERM GOAL #4   Title improve FOTO =/< 51% limited (06/18/16)    Status On-going     PT LONG TERM GOAL #5   Title patient to report abilty to stand for >30 min without incresaed LBP 06/18/16   Status On-going               Plan - 06/03/16 1232    Clinical Impression Statement [name] had good response to manual work today with reduction of Lt sided symptoms.  She had multiple trigger points in her gluts, Rt TFL and lateral hamstring. Progressing to goals.  She would benefit from another treatment of DN and manual work along with continued core strengthening and eccentric Rt quad work.    Rehab Potential Good   PT Frequency 2x / week   PT Duration 4 weeks   PT Treatment/Interventions Moist Heat;Traction;Ultrasound;Therapeutic exercise;Therapeutic activities;Dry needling;Taping;Manual techniques;Neuromuscular re-education;Cryotherapy;Electrical Stimulation;Patient/family education   PT Next Visit Plan Dry needling/ manual therapy. Assess need for more rock tape from previous treatment.    Consulted and Agree with Plan of Care Patient      Patient will benefit from skilled therapeutic intervention in order to improve the following deficits and impairments:  Decreased strength, Pain, Improper body mechanics, Decreased range of motion, Increased muscle spasms  Visit Diagnosis: Chronic left-sided low back pain with left-sided sciatica  Muscle weakness (generalized)  Other muscle spasm     Problem List Patient Active Problem List   Diagnosis Date Noted  . Moderate persistent asthma 10/29/2014  . Rhinitis 10/29/2014  . GERD (gastroesophageal reflux disease) 10/29/2014  .  Intraductal papilloma of breast 05/01/2012    Jeral Pinch PT  06/03/2016, 12:34 PM  [facility] [place], [place], [address] Phone: [phone]   Fax:  [phone]  Name: [name] MRN: [id number] Date of Birth: [birth year]   PHYSICAL THERAPY DISCHARGE SUMMARY  Visits from Start of Care: 10  Current functional  level related to goals / functional outcomes: unknown   Remaining deficits: unknown   Education / Equipment: HEP, body mechanics Plan:                                                    Patient goals were partially met. Patient is being discharged due to not returning since the last visit.  ?????     Jeral Pinch, PT 07/26/16 8:27 AM

## 2016-06-11 ENCOUNTER — Encounter: Payer: Commercial Managed Care - PPO | Admitting: Physical Therapy

## 2016-06-17 ENCOUNTER — Ambulatory Visit (INDEPENDENT_AMBULATORY_CARE_PROVIDER_SITE_OTHER): Payer: Commercial Managed Care - PPO | Admitting: Endocrinology

## 2016-06-17 ENCOUNTER — Encounter: Payer: Self-pay | Admitting: Endocrinology

## 2016-06-17 VITALS — BP 140/88 | HR 88 | Ht 62.0 in | Wt 240.0 lb

## 2016-06-17 DIAGNOSIS — E041 Nontoxic single thyroid nodule: Secondary | ICD-10-CM | POA: Diagnosis not present

## 2016-06-17 NOTE — Progress Notes (Signed)
Patient ID: [name], female   DOB: August 22, 1965, 51 y.o.   MRN: [id number]            Reason for Appointment: Thyroid nodule, new consultation  Referring physician: Claiborne Billings    History of Present Illness:   In 1991 the patient had difficulty swallowing and choking sensation and was found to have a thyroid nodule on the right side. Apparently this may have been substernal as it was not very visibly enlarged but with thyroid lobectomy her symptoms improved After her surgery she was placed on levothyroxine 50 g daily and she has been continued on this  A few months ago she noticed a knot on the right side of her neck and she was sent for a thyroid ultrasound This only showed a 0.3 cm left-sided nodule and no other abnormalities, right-sided showed only residual thyroid tissue.  Left lobe measured 3.9 cm She has had no difficulty with swallowing or choking sensation now  She says she had thyroid levels checked last year with her annual exam and she has these checked every year, usually these are normal   Lab Results  Component Value Date   TSH 2.60 09/27/2015      Allergies as of 06/17/2016      Reactions   Sesame Oil Rash   Sulfa Antibiotics Rash   Sulfamethoxazole Rash      Medication List       Accurate as of 06/17/16  4:59 PM. Always use your most recent med list.          Albuterol Sulfate 108 (90 Base) MCG/ACT Aepb Commonly known as:  PROAIR RESPICLICK Inhale 2 puffs into the lungs every 6 (six) hours as needed.   budesonide 180 MCG/ACT inhaler Commonly known as:  PULMICORT FLEXHALER Inhale 2 puffs into the lungs daily.   doxycycline 100 MG EC tablet Commonly known as:  DORYX Take 100 mg by mouth daily.   folic acid 299 MCG tablet Commonly known as:  FOLVITE Take 400 mcg by mouth daily.   Ginger Root 250 MG Caps Take 1 tablet by mouth daily.   L-Lysine 500 MG Caps Take 1 capsule by mouth daily.   levocetirizine 5 MG tablet Commonly known as:   XYZAL Take 5 mg by mouth at bedtime.   levothyroxine 50 MCG tablet Commonly known as:  SYNTHROID, LEVOTHROID Take 1 tablet (50 mcg total) by mouth daily.   multivitamin tablet Take 1 tablet by mouth daily.   naproxen sodium 220 MG tablet Commonly known as:  ANAPROX Take 220 mg by mouth as needed.   omeprazole 40 MG capsule Commonly known as:  PRILOSEC Take 1 capsule (40 mg total) by mouth daily.   ranitidine 150 MG tablet Commonly known as:  ZANTAC Take 2 tablets (300 mg total) by mouth at bedtime.   sertraline 100 MG tablet Commonly known as:  ZOLOFT Take 1 tablet (100 mg total) by mouth daily.   triamcinolone cream 0.1 % Commonly known as:  KENALOG APPLY 2-3 TIMES DAILY AS NEEDED FOR ITCHY AREAS FOR 2-3 WEEKS   VIMOVO 500-20 MG Tbec Generic drug:  Naproxen-Esomeprazole Take 1 tablet by mouth as needed.       Allergies:  Allergies  Allergen Reactions  . Sesame Oil Rash  . Sulfa Antibiotics Rash  . Sulfamethoxazole Rash    Past Medical History:  Diagnosis Date  . Abnormal Pap smear of cervix 1992   hx cryotherapy to cervix/also abn.pap 2004--Hyst  . Anemia  2015 to present  . Anxiety   . Arthritis    hands, legs  . Cavernous malformation    "indicative of brain lipoma"; is followed at [facility]  . Complication of anesthesia    hard to wake up post-op  . Endometriosis   . Fibroid   . GERD (gastroesophageal reflux disease)   . Headache(784.0)    migraines  . Hypothyroidism    Hx Rt.thyroid nodule--removed 1991 in [place]  . 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 Motion sickness    due to cavernous malformation  . Osteoporosis     Past Surgical History:  Procedure Laterality Date  . BREAST LUMPECTOMY WITH NEEDLE LOCALIZATION Left 05/27/2012   Procedure: BREAST LUMPECTOMY WITH NEEDLE LOCALIZATION;  Surgeon: Imogene Burn. Georgette Dover, MD;  Location: [facility];  Service: General;  Laterality: Left;  needle localization at breast center  of [place]   . BREAST SURGERY  2014   Left lumpectomy--benign mass  . broken foot Left 02/2015  . CESAREAN SECTION  1985  . CHOLECYSTECTOMY  1994  . EVALUATION UNDER ANESTHESIA WITH ANAL FISTULECTOMY  2011  . OOPHORECTOMY  2004   BSO--endometriosis  . THYROID LOBECTOMY  1990  . TOTAL ABDOMINAL HYSTERECTOMY  04/20/2002   with left uterolysis/BSO  . TUBAL LIGATION  1991    Family History  Problem Relation Age of Onset  . Cancer Mother     kidney  . Anesthesia problems Mother     post-op N/V  . Cancer Father     melanoma  . Hypertension Father   . Diabetes Maternal Grandmother   . Goiter Paternal Aunt     Social History:  reports that she quit smoking about 6 years ago. Her smoking use included Cigarettes. She has never used smokeless tobacco. She reports that she does not drink alcohol or use drugs.      Review of Systems  Constitutional: Positive for weight gain.       She thinks she has gained 50 pounds over the last year mostly because of being inactive  HENT: Positive for hoarseness. Negative for trouble swallowing.        She had long-standing occasional hoarseness but also history of reflux  Respiratory: Negative for shortness of breath.   Musculoskeletal: Positive for back pain.      Examination:   BP 140/88 (Cuff Size: Large)   Pulse 88   Ht 5\' 2"  (1.575 m)   Wt 240 lb (108.9 kg)   SpO2 96%   BMI 43.90 kg/m    General Appearance: pleasant, has generalized obesity         Eyes: No  prominence or eyelid swelling.          Neck: The thyroid is not palpable  There is no lymphadenopathy.     Cardiovascular: Normal  heart sounds, no murmur Respiratory:  Lungs clear both anteriorly and posteriorly  Neurological: REFLEXES: at biceps are normal.  Skin: no rash      No peripheral edema present  Assessment/Plan:  History of thyroid nodule treated with right lobectomy She has an incidental 0.3 cm nodule on the left side which is not of any significance  currently  She has been on suppressive treatment with only 50 g of levothyroxine since her 1991 surgery Explained to her that she does not have any evidence of disease on the left side otherwise and does not need to be on long-term supplementation with a small dose and she can  stop this now  She will have thyroid level checked again with her annual exam later this year She can also have a repeat thyroid ultrasound done in about a year from her PCP  ?  Hypertension: She appears to have significant hypertension, currently untreated She needs to follow-up with her PCP  [facility] 06/17/2016   Consultation note forwarded to referring physician

## 2016-06-17 NOTE — Patient Instructions (Signed)
Stop Synthroid.  

## 2016-06-18 DIAGNOSIS — E041 Nontoxic single thyroid nodule: Secondary | ICD-10-CM | POA: Insufficient documentation

## 2016-06-19 ENCOUNTER — Encounter: Payer: Commercial Managed Care - PPO | Admitting: Rehabilitative and Restorative Service Providers"

## 2016-06-20 ENCOUNTER — Encounter: Payer: Commercial Managed Care - PPO | Admitting: Rehabilitative and Restorative Service Providers"

## 2016-10-07 ENCOUNTER — Other Ambulatory Visit: Payer: Self-pay | Admitting: Obstetrics and Gynecology

## 2016-10-07 NOTE — Telephone Encounter (Signed)
Medication refill request: Zoloft  Last AEX:  09-27-15  Next AEX: 9- 18-18  Last MMG (if hormonal medication request): 09-01-15 WNL  Refill authorized: please advise

## 2016-10-23 ENCOUNTER — Ambulatory Visit: Payer: Commercial Managed Care - PPO | Admitting: Obstetrics and Gynecology

## 2016-10-30 NOTE — Progress Notes (Deleted)
51 y.o. G22P1001 Married Caucasian female here for annual exam.    PCP:     No LMP recorded. Patient has had a hysterectomy.           Sexually active: {yes no:314532}  The current method of family planning is status post hysterectomy/BSO.    Exercising: {yes WU:981191}  {types:19826} Smoker:  Former  Health Maintenance: Pap: 06/2014 normal per patient, 07-16-13 Neg History of abnormal Pap:  YNW,2956 hx cryotherapy to cervix and patient states had abnormal pap 2004 and led to Hysterectomy along with endometriosis. MMG: 09-18-16 3D Density B/Neg/BiRads2:[facility] Colonoscopy: 02/2012 normal per patient:Dr. Butler--[place];next due 2024  BMD:   07/2015  Result Osteoporosis; T score -2.4 but osteoporosis due to fragility fx.:[facility] TDaP:  PCP Gardasil:   no HIV:*** Hep C:09-27-15 Neg Screening Labs:  Hb today: ***, Urine today: ***   reports that she quit smoking about 6 years ago. Her smoking use included Cigarettes. She has never used smokeless tobacco. She reports that she does not drink alcohol or use drugs.  Past Medical History:  Diagnosis Date  . Abnormal Pap smear of cervix 1992   hx cryotherapy to cervix/also abn.pap 2004--Hyst  . Anemia    2015 to present  . Anxiety   . Arthritis    hands, legs  . Cavernous malformation    "indicative of brain lipoma"; is followed at [facility]  . Complication of anesthesia    hard to wake up post-op  . Endometriosis   . Fibroid   . GERD (gastroesophageal reflux disease)   . Headache(784.0)    migraines  . Hypothyroidism    Hx Rt.thyroid nodule--removed 1991 in [place]  . 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 Motion sickness    due to cavernous malformation  . Osteoporosis     Past Surgical History:  Procedure Laterality Date  . BREAST LUMPECTOMY WITH NEEDLE LOCALIZATION Left 05/27/2012   Procedure: BREAST LUMPECTOMY WITH NEEDLE LOCALIZATION;  Surgeon: Imogene Burn. Georgette Dover, MD;  Location: [facility];  Service: General;   Laterality: Left;  needle localization at breast center of [place]   . BREAST SURGERY  2014   Left lumpectomy--benign mass  . broken foot Left 02/2015  . CESAREAN SECTION  1985  . CHOLECYSTECTOMY  1994  . EVALUATION UNDER ANESTHESIA WITH ANAL FISTULECTOMY  2011  . OOPHORECTOMY  2004   BSO--endometriosis  . THYROID LOBECTOMY  1990  . TOTAL ABDOMINAL HYSTERECTOMY  04/20/2002   with left uterolysis/BSO  . TUBAL LIGATION  1991    Current Outpatient Prescriptions  Medication Sig Dispense Refill  . Albuterol Sulfate (PROAIR RESPICLICK) 213 (90 Base) MCG/ACT AEPB Inhale 2 puffs into the lungs every 6 (six) hours as needed. 1 each 3  . budesonide (PULMICORT FLEXHALER) 180 MCG/ACT inhaler Inhale 2 puffs into the lungs daily. 1 each 11  . doxycycline (DORYX) 100 MG EC tablet Take 100 mg by mouth daily.    . folic acid (FOLVITE) 086 MCG tablet Take 400 mcg by mouth daily.    . Ginger, Zingiber officinalis, (GINGER ROOT) 250 MG CAPS Take 1 tablet by mouth daily.    Marland Kitchen L-Lysine 500 MG CAPS Take 1 capsule by mouth daily.    Marland Kitchen levocetirizine (XYZAL) 5 MG tablet Take 5 mg by mouth at bedtime.  5  . levothyroxine (SYNTHROID, LEVOTHROID) 50 MCG tablet Take 1 tablet (50 mcg total) by mouth daily. 90 tablet 3  . Multiple Vitamin (MULTIVITAMIN) tablet Take 1 tablet by  mouth daily.    . naproxen sodium (ANAPROX) 220 MG tablet Take 220 mg by mouth as needed.     Marland Kitchen omeprazole (PRILOSEC) 40 MG capsule Take 1 capsule (40 mg total) by mouth daily. 30 capsule 11  . ranitidine (ZANTAC) 150 MG tablet Take 2 tablets (300 mg total) by mouth at bedtime. (Patient taking differently: Take 300 mg by mouth as needed. ) 60 tablet 11  . sertraline (ZOLOFT) 100 MG tablet TAKE 1 TABLET (100 MG TOTAL) BY MOUTH DAILY. 90 tablet 0  . triamcinolone cream (KENALOG) 0.1 % APPLY 2-3 TIMES DAILY AS NEEDED FOR ITCHY AREAS FOR 2-3 WEEKS  1  . VIMOVO 500-20 MG TBEC Take 1 tablet by mouth as needed.   2   No current facility-administered  medications for this visit.     Family History  Problem Relation Age of Onset  . Cancer Mother        kidney  . Anesthesia problems Mother        post-op N/V  . Cancer Father        melanoma  . Hypertension Father   . Diabetes Maternal Grandmother   . Goiter Paternal Aunt     ROS:  Pertinent items are noted in HPI.  Otherwise, a comprehensive ROS was negative.  Exam:   There were no vitals taken for this visit.    General appearance: alert, cooperative and appears stated age Head: Normocephalic, without obvious abnormality, atraumatic Neck: no adenopathy, supple, symmetrical, trachea midline and thyroid normal to inspection and palpation Lungs: clear to auscultation bilaterally Breasts: normal appearance, no masses or tenderness, No nipple retraction or dimpling, No nipple discharge or bleeding, No axillary or supraclavicular adenopathy Heart: regular rate and rhythm Abdomen: soft, non-tender; no masses, no organomegaly Extremities: extremities normal, atraumatic, no cyanosis or edema Skin: Skin color, texture, turgor normal. No rashes or lesions Lymph nodes: Cervical, supraclavicular, and axillary nodes normal. No abnormal inguinal nodes palpated Neurologic: Grossly normal  Pelvic: External genitalia:  no lesions              Urethra:  normal appearing urethra with no masses, tenderness or lesions              Bartholins and Skenes: normal                 Vagina: normal appearing vagina with normal color and discharge, no lesions              Cervix: no lesions              Pap taken: {yes no:314532} Bimanual Exam:  Uterus:  normal size, contour, position, consistency, mobility, non-tender              Adnexa: no mass, fullness, tenderness              Rectal exam: {yes no:314532}.  Confirms.              Anus:  normal sphincter tone, no lesions  Chaperone was present for exam.  Assessment:   Well woman visit with normal exam.   Plan: Mammogram screening  discussed. Recommended self breast awareness. Pap and HR HPV as above. Guidelines for Calcium, Vitamin D, regular exercise program including cardiovascular and weight bearing exercise.   Follow up annually and prn.   Additional counseling given.  {yes [id number]. _______ minutes face to face time of which over 50% was spent in counseling.    After visit summary provided.

## 2016-10-31 ENCOUNTER — Ambulatory Visit (INDEPENDENT_AMBULATORY_CARE_PROVIDER_SITE_OTHER): Payer: Commercial Managed Care - PPO

## 2016-10-31 ENCOUNTER — Other Ambulatory Visit: Payer: Self-pay | Admitting: Family Medicine

## 2016-10-31 DIAGNOSIS — L52 Erythema nodosum: Secondary | ICD-10-CM | POA: Diagnosis not present

## 2016-11-04 ENCOUNTER — Ambulatory Visit: Payer: Commercial Managed Care - PPO | Admitting: Obstetrics and Gynecology

## 2016-11-12 ENCOUNTER — Encounter: Payer: Self-pay | Admitting: Obstetrics and Gynecology

## 2017-01-02 ENCOUNTER — Other Ambulatory Visit: Payer: Self-pay | Admitting: Obstetrics and Gynecology

## 2017-01-02 NOTE — Telephone Encounter (Signed)
Medication refill request: Zoloft  Last AEX:  09-27-15  Next AEX: 02-03-17 Last MMG (if hormonal medication request): 09-18-17 WNL  Refill authorized: please advise

## 2017-02-03 ENCOUNTER — Ambulatory Visit: Payer: Self-pay | Admitting: Obstetrics and Gynecology

## 2017-03-23 ENCOUNTER — Other Ambulatory Visit: Payer: Self-pay | Admitting: Allergy and Immunology

## 2017-04-02 ENCOUNTER — Other Ambulatory Visit: Payer: Self-pay | Admitting: Obstetrics and Gynecology

## 2017-04-02 NOTE — Telephone Encounter (Signed)
Medication refill request: Zoloft Last AEX:  09-27-15  Next AEX: not scheduled ( patient was scheduled 02-03-17)  Last MMG (if hormonal medication request): 09-18-16 WNL  Refill authorized: please advise

## 2017-04-02 NOTE — Telephone Encounter (Signed)
Please contact patient to reschedule appointment.  I will refill for one month.

## 2017-04-02 NOTE — Telephone Encounter (Signed)
Left message to call and schedule AEX -eh

## 2017-04-22 ENCOUNTER — Other Ambulatory Visit: Payer: Self-pay | Admitting: Allergy and Immunology

## 2017-05-27 ENCOUNTER — Ambulatory Visit: Payer: Commercial Managed Care - PPO | Admitting: Obstetrics and Gynecology

## 2017-07-31 ENCOUNTER — Encounter: Payer: Self-pay | Admitting: Physical Therapy

## 2017-07-31 ENCOUNTER — Ambulatory Visit: Payer: Commercial Managed Care - PPO | Admitting: Physical Therapy

## 2017-07-31 DIAGNOSIS — R2689 Other abnormalities of gait and mobility: Secondary | ICD-10-CM | POA: Diagnosis not present

## 2017-07-31 DIAGNOSIS — M6281 Muscle weakness (generalized): Secondary | ICD-10-CM

## 2017-07-31 DIAGNOSIS — M25561 Pain in right knee: Secondary | ICD-10-CM | POA: Diagnosis not present

## 2017-07-31 DIAGNOSIS — M25661 Stiffness of right knee, not elsewhere classified: Secondary | ICD-10-CM | POA: Diagnosis not present

## 2017-07-31 DIAGNOSIS — M25461 Effusion, right knee: Secondary | ICD-10-CM | POA: Diagnosis not present

## 2017-07-31 NOTE — Therapy (Signed)
[facility], [place], [address] Phone: [phone]   Fax:  [phone]  Physical Therapy Evaluation  Patient Details  Name: KRESTA TEMPLEMAN MRN: [id number] Date of Birth: 1965/03/06 Referring Provider: Dr Lavenia Atlas   Encounter Date: 07/31/2017  PT End of Session - 07/31/17 1422    Visit Number  1    Number of Visits  12    Date for PT Re-Evaluation  09/11/17    PT Start Time  0539    PT Stop Time  1519    PT Time Calculation (min)  57 min       Past Medical History:  Diagnosis Date  . Abnormal Pap smear of cervix 1992   hx cryotherapy to cervix/also abn.pap 2004--Hyst  . Anemia    2015 to present  . Anxiety   . Arthritis    hands, legs  . Cavernous malformation    "indicative of brain lipoma"; is followed at [facility]  . Complication of anesthesia    hard to wake up post-op  . Endometriosis   . Fibroid   . GERD (gastroesophageal reflux disease)   . Headache(784.0)    migraines  . Hypothyroidism    Hx Rt.thyroid nodule--removed 1991 in [place]  . 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 Motion sickness    due to cavernous malformation  . Osteoporosis     Past Surgical History:  Procedure Laterality Date  . BREAST LUMPECTOMY WITH NEEDLE LOCALIZATION Left 05/27/2012   Procedure: BREAST LUMPECTOMY WITH NEEDLE LOCALIZATION;  Surgeon: Imogene Burn. Georgette Dover, MD;  Location: [facility];  Service: General;  Laterality: Left;  needle localization at breast center of [place]   . BREAST SURGERY  2014   Left lumpectomy--benign mass  . broken foot Left 02/2015  . CESAREAN SECTION  1985  . CHOLECYSTECTOMY  1994  . EVALUATION UNDER ANESTHESIA WITH ANAL FISTULECTOMY  2011  . OOPHORECTOMY  2004   BSO--endometriosis  . THYROID LOBECTOMY  1990  . TOTAL ABDOMINAL HYSTERECTOMY  04/20/2002   with left uterolysis/BSO  . TUBAL LIGATION  1991    There were no vitals filed for this  visit.   Subjective Assessment - 07/31/17 1426    Subjective  Pt reports she was having knee pain when she was hear last year, was going to have a knee MRI and that morning fell and broke her foot.  Had to wait for that to heal to have the MRI. It showed torn meniscus and cartilage/arthritis.  She told MD that her work sends them to [facility] for the surgery.  This was scoped almost a week ago and is now coming to PT for therapy.      Pertinent History  Osteoporosis - had recent bone density scan, Rt foot is still healing, 5th metatarsal fx - local MD doesn't want to place her back in a boot because of the recent knee scope.     How long can you walk comfortably?  using cane has pain immediately in the knee - has foot pain after she sits and rests.     Diagnostic tests  MRI, x-rays    Patient Stated Goals  walk without an assistive device    Currently in Pain?  Yes    Pain Score  6     Pain Location  Knee    Pain Orientation  Right    Pain Descriptors / Indicators  Tightness;Burning;Heaviness  Pain Type  Surgical pain    Pain Onset  More than a month ago    Pain Frequency  Constant    Aggravating Factors   walking, sleeping has a lot of pain    Pain Relieving Factors  nothing sometimes rest and meloxicam         [facility] PT Assessment - 07/31/17 0001      Assessment   Medical Diagnosis  Rt knee scope    Referring Provider  Dr Honor Loh Coupens    Onset Date/Surgical Date  07/25/17    Next MD Visit  MD to perform phone call follow ups    Prior Therapy  not for this      Precautions   Precautions  None listen to her body respect pain      Balance Screen   Has the patient fallen in the past 6 months  Yes    How many times?  1 3/8 with broken foot    Has the patient had a decrease in activity level because of a fear of falling?   Yes    Is the patient reluctant to leave their home because of a fear of falling?   Yes      [place] residence    Home  Access  Stairs to enter 5 - struggling with these, railing both sides    Home Layout  One level      Prior Function   Level of Independence  Independent using shower chair, can't donn/doff socks/shoes    Vocation  Full time employment    Vocation Requirements  office, desk work    Leisure  walk and go camping with grandchildren RV      Observation/Other Assessments   Focus on Therapeutic Outcomes (FOTO)   73% limited      Observation/Other Assessments-Edema    Edema  -- (+) edema in Rt knee      ROM / Strength   AROM / PROM / Strength  AROM;PROM;Strength      AROM   AROM Assessment Site  Knee    Right/Left Knee  Right;Left    Right Knee Extension  -14    Right Knee Flexion  113    Left Knee Extension  0    Left Knee Flexion  128      PROM   PROM Assessment Site  Knee    Right/Left Knee  Right    Right Knee Extension  -7    Right Knee Flexion  118      Strength   Strength Assessment Site  Hip;Knee;Ankle    Right/Left Hip  Right    Right Hip Extension  4/5    Right Hip ABduction  3+/5    Right/Left Knee  Right Lt WNL    Right Knee Flexion  4/5    Right Knee Extension  3+/5    Right/Left Ankle  -- Lt WNL, Rt grossly 4+/5      Flexibility   Soft Tissue Assessment /Muscle Length  yes    Quadriceps  prone knee flex Lt 130, Rt 112      Palpation   Palpation comment  tender in Rt quad and all around the knee, tight Rt hamstring with palpation.       Ambulation/Gait   Ambulation/Gait  Yes    Ambulation/Gait Assistance  6: Modified independent (Device/Increase time)    Ambulation Distance (Feet)  -- observed in clinic  Assistive device  Straight cane    Gait Pattern  Step-to pattern;Decreased stance time - left;Decreased step length - right;Antalgic                Objective measurements completed on examination: See above findings.      [facility] Adult PT Treatment/Exercise - 07/31/17 0001      Exercises   Exercises  Knee/Hip      Knee/Hip Exercises:  Stretches   Passive Hamstring Stretch  Right;30 seconds with strap    Quad Stretch  Right;30 seconds prone with strap    Gastroc Stretch  Right;30 seconds standing VC for form      Knee/Hip Exercises: Standing   Heel Raises  Both;10 reps      Knee/Hip Exercises: Seated   Long Arc Quad  Strengthening;Right;10 reps    Heel Slides  AAROM;Right;10 reps      Knee/Hip Exercises: Supine   Quad Sets  Strengthening;Right;10 reps VC for form and encouragement      Modalities   Modalities  Vasopneumatic      Vasopneumatic   Number Minutes Vasopneumatic   15 minutes    Vasopnuematic Location   Knee Rt    Vasopneumatic Pressure  Medium    Vasopneumatic Temperature   3*             PT Education - 07/31/17 1508    Education Details  HEP and RICE    Person(s) Educated  Patient    Methods  Handout;Demonstration;Explanation    Comprehension  Verbalized understanding;Returned demonstration          PT Long Term Goals - 07/31/17 1515      PT LONG TERM GOAL #1   Title  I with advanced HEP to include a walking program ( 09/11/17)     Time  6    Period  Weeks    Status  New      PT LONG TERM GOAL #2   Title  ambulate on even surfaces without an assistive device and minimal knee pain ( 09/11/17)     Time  6    Period  Weeks    Status  New      PT LONG TERM GOAL #3   Title  ambulate up/down steps with alternating gait pattern and one rail assist ( 09/11/17)     Time  6    Period  Weeks    Status  New      PT LONG TERM GOAL #4   Title  improve FOTO =/< 52% limited (09/11/17)     Time  6    Period  Weeks    Status  On-going      PT LONG TERM GOAL #5   Title  demo Rt knee ROM 0-120 to assist with mobility ( 09/11/17)     Time  6    Period  Weeks    Status  New      Additional Long Term Goals   Additional Long Term Goals  Yes      PT LONG TERM GOAL #6   Title  demo Rt LE strength =/> 4+/5 throughout to allow her to get in/out of her camper and around the campground (  09/11/17)     Time  6    Period  Weeks    Status  New      PT LONG TERM GOAL #7   Title  report Rt knee pain no more than 1-2/10 at the end of  a work shift ( 09/11/17)     Time  6    Period  Weeks    Status  New             Plan - 07/31/17 1510    Clinical Impression Statement  52 yo female 6 days s/p Rt knee scope out of town.  She is also healing from a Rt 5th metatarsal fx and newly diagnosed with osteoporosis.  She has a lot of edema in the Rt knee, limited ROM and weakness in the Rt LE partially from the surgery and from having the foot fx.  She is walking with an assistive device and gait deviations.  She will most likely be a slow healer and take a little longer to restore her PLOF due to multiple other medical dx.  She has returned to work and the leg is in a dependent position causing increased swelling into her foot.     History and Personal Factors relevant to plan of care:  osteoporosis, non-healing Rt 5th metatarsal fx, h/o back pain, anemia    Clinical Presentation  Evolving    Clinical Decision Making  Moderate    Rehab Potential  Good    PT Frequency  2x / week    PT Duration  6 weeks    PT Treatment/Interventions  Iontophoresis 4mg /ml Dexamethasone;Gait training;Stair training;Neuromuscular re-education;Dry needling;Manual techniques;Moist Heat;Taping;Patient/family education;Therapeutic activities;Ultrasound;Therapeutic exercise;Cryotherapy;Electrical Stimulation;Balance training;Passive range of motion;Scar mobilization;Vasopneumatic Device    PT Next Visit Plan  progress Rt knee ROM, LE strengthening, modalities for pain and edema. Respecting pain in Rt foot     Consulted and Agree with Plan of Care  Patient       Patient will benefit from skilled therapeutic intervention in order to improve the following deficits and impairments:  Pain, Increased muscle spasms, Decreased coordination, Decreased range of motion, Decreased strength, Increased edema, Difficulty  walking, Decreased balance  Visit Diagnosis: Acute pain of right knee  Muscle weakness (generalized)  Stiffness of right knee, not elsewhere classified  Swelling of joint of right knee  Other abnormalities of gait and mobility     Problem List Patient Active Problem List   Diagnosis Date Noted  . Thyroid nodule 06/18/2016  . Moderate persistent asthma 10/29/2014  . Rhinitis 10/29/2014  . GERD (gastroesophageal reflux disease) 10/29/2014  . Intraductal papilloma of breast 05/01/2012    Manuela Schwartz shaver PT  07/31/2017, 3:18 PM  [facility] [place], [place], [address] Phone: [phone]   Fax:  [phone]  Name: WYLIE RUSSON MRN: [id number] Date of Birth: 1965-12-22

## 2017-07-31 NOTE — Patient Instructions (Addendum)
Quads / HF, Prone    Lie face down, knees together. Grasp one ankle with same-side hand. Use towel if needed to reach. Gently pull foot toward buttock. Hold _45__ seconds. Repeat __2_ times per session. Do _2__ sessions per day.  Supine: Leg Stretch with Strap (Super Advanced)    Lie on back with one leg straight. Hook strap around other foot. Straighten knee. Raise leg to maximal stretch and straighten knee further by tightening quadriceps. Slowly press other leg down as close to floor as possible. Keep lower abdominals tight. Hold _45__ seconds. Warning: Intense stretch. Stay within tolerance. Repeat _2__ times per session. Do _2__ sessions per day.  Gastroc Stretch    Stand with right foot back, leg straight, forward leg bent. Keeping heel on floor, turned slightly out, lean into wall until stretch is felt in calf. Hold __45__ seconds. Repeat __2__ times per set. Do __1__ sets per session. Do ___2_ sessions per day.  Quad Set    With right leg bent, foot flat, slowly tighten muscles on thigh of straight leg while counting out loud to _5___.Repeat __10__ times. Do __2__ sessions per day.  Knee Extension (Sitting)    Place __0__ pound weight on left ankle and straighten knee fully, lower slowly. Cross legs at ankles. With front leg, push other leg until stretch is felt. Relax. Recross legs at ankles. Repeat _10___ times per set. Do _1___ sets per session. Do __2__ sessions per day. Hold at each end range for 2-3 sec  Heel Raises    Stand with support. Tighten pelvic floor and hold. With knees straight, raise heels off ground. Hold _1-2__ seconds. Relax for _1-2__ seconds. Repeat _10__ times. Do _2__ times a day.

## 2017-08-04 ENCOUNTER — Ambulatory Visit: Payer: Commercial Managed Care - PPO | Admitting: Physical Therapy

## 2017-08-04 DIAGNOSIS — M25661 Stiffness of right knee, not elsewhere classified: Secondary | ICD-10-CM | POA: Diagnosis not present

## 2017-08-04 DIAGNOSIS — M25461 Effusion, right knee: Secondary | ICD-10-CM

## 2017-08-04 DIAGNOSIS — M6281 Muscle weakness (generalized): Secondary | ICD-10-CM | POA: Diagnosis not present

## 2017-08-04 DIAGNOSIS — M25561 Pain in right knee: Secondary | ICD-10-CM | POA: Diagnosis not present

## 2017-08-04 DIAGNOSIS — R2689 Other abnormalities of gait and mobility: Secondary | ICD-10-CM

## 2017-08-04 NOTE — Therapy (Signed)
[place], [place], [address] Phone: [phone]   Fax:  [phone]  Physical Therapy Treatment  Patient Details  Name: [name] MRN: [id number] Date of Birth: [birth year] Referring Provider: Dr. Milus Banister   Encounter Date: 08/04/2017  PT End of Session - 08/04/17 1110    Visit Number  2    Number of Visits  12    Date for PT Re-Evaluation  09/11/17    PT Start Time  1105    PT Stop Time  1159    PT Time Calculation (min)  54 min       Past Medical History:  Diagnosis Date  . Abnormal Pap smear of cervix 1992   hx cryotherapy to cervix/also abn.pap 2004--Hyst  . Anemia    2015 to present  . Anxiety   . Arthritis    hands, legs  . Cavernous malformation    "indicative of brain lipoma"; is followed at [facility]  . Complication of anesthesia    hard to wake up post-op  . Endometriosis   . Fibroid   . GERD (gastroesophageal reflux disease)   . Headache(784.0)    migraines  . Hypothyroidism    Hx Rt.thyroid nodule--removed 1991 in [place]  . 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 Motion sickness    due to cavernous malformation  . Osteoporosis     Past Surgical History:  Procedure Laterality Date  . BREAST LUMPECTOMY WITH NEEDLE LOCALIZATION Left 05/27/2012   Procedure: BREAST LUMPECTOMY WITH NEEDLE LOCALIZATION;  Surgeon: Imogene Burn. Georgette Dover, MD;  Location: [place];  Service: General;  Laterality: Left;  needle localization at breast center of [place]   . BREAST SURGERY  2014   Left lumpectomy--benign mass  . broken foot Left 02/2015  . CESAREAN SECTION  1985  . CHOLECYSTECTOMY  1994  . EVALUATION UNDER ANESTHESIA WITH ANAL FISTULECTOMY  2011  . OOPHORECTOMY  2004   BSO--endometriosis  . THYROID LOBECTOMY  1990  . TOTAL ABDOMINAL HYSTERECTOMY  04/20/2002   with left uterolysis/BSO  . TUBAL LIGATION  1991    There were no vitals filed for this  visit.  Subjective Assessment - 08/04/17 1111    Subjective  Pt reports she had her stitches 3 days ago (now has steri-strips in place).    She ambulates in to therapy with single point cane in Rt hand.  She has been working on [employer] as she is able.     Patient Stated Goals  walk without an assistive device    Currently in Pain?  Yes    Pain Score  5     Pain Location  Knee    Pain Orientation  Right    Pain Descriptors / Indicators  Tightness;Burning;Heaviness    Aggravating Factors   walking, sleeping    Pain Relieving Factors  rest, medicine.          [facility] PT Assessment - 08/04/17 0001      Assessment   Medical Diagnosis  Rt knee scope    Referring Provider  Dr. Milus Banister    Onset Date/Surgical Date  07/25/17      AROM   Right/Left Knee  Right    Right Knee Extension  -8    Right Knee Flexion  118         [facility] Adult PT Treatment/Exercise - 08/04/17 0001      Knee/Hip Exercises: Stretches  Passive Hamstring Stretch  Right;Left;30 seconds;3 reps    Quad Stretch  Right;3 reps;30 seconds 2 reps seated, 1 rep supine    Gastroc Stretch  Right;30 seconds;2 reps standing VC for form      Knee/Hip Exercises: Aerobic   Nustep  L3: for Rt knee ROM x 5 min       Knee/Hip Exercises: Standing   Gait Training  160 ft with SPC:  VC for cane placement, step length, and weight shift; improved with cues.     Other Standing Knee Exercises  Rt TKE wiht back of leg against ball x 5 sec hold x 5 reps - poor tolerance.       Knee/Hip Exercises: Seated   Long Arc Quad  Strengthening;Right;1 set;10 reps 10 sec holds    Heel Slides  AROM;Right;1 set;10 reps      Knee/Hip Exercises: Supine   Quad Sets  Strengthening;Right;10 reps VC for form and encouragement      Vasopneumatic   Number Minutes Vasopneumatic   15 minutes    Vasopnuematic Location   Knee Rt    Vasopneumatic Pressure  Medium    Vasopneumatic Temperature   34 deg          PT Long Term Goals - 08/04/17 1718       PT LONG TERM GOAL #1   Title  I with advanced HEP to include a walking program ( 09/11/17)     Time  6    Period  Weeks    Status  On-going      PT LONG TERM GOAL #2   Title  ambulate on even surfaces without an assistive device and minimal knee pain ( 09/11/17)     Time  6    Period  Weeks    Status  On-going      PT LONG TERM GOAL #3   Title  ambulate up/down steps with alternating gait pattern and one rail assist ( 09/11/17)     Time  6    Period  Weeks    Status  On-going      PT LONG TERM GOAL #4   Title  improve FOTO =/< 52% limited (09/11/17)     Time  6    Period  Weeks    Status  On-going      PT LONG TERM GOAL #5   Title  demo Rt knee ROM 0-120 to assist with mobility ( 09/11/17)     Time  6    Period  Weeks    Status  On-going      PT LONG TERM GOAL #6   Title  demo Rt LE strength =/> 4+/5 throughout to allow her to get in/out of her camper and around the campground ( 09/11/17)     Time  6    Period  Weeks    Status  On-going      PT LONG TERM GOAL #7   Title  report Rt knee pain no more than 1-2/10 at the end of a work shift ( 09/11/17)     Time  6    Period  Weeks    Status  On-going            Plan - 08/04/17 1718    Clinical Impression Statement  Pt continues with limited Rt knee ROM; pain is limiting factor, however extension has improved. .  She reported increased back pain at end of session when RLE was in vaso; may benefit  from heat next visit (during vaso).      Rehab Potential  Good       Patient will benefit from skilled therapeutic intervention in order to improve the following deficits and impairments:  Pain, Increased muscle spasms, Decreased coordination, Decreased range of motion, Decreased strength, Increased edema, Difficulty walking, Decreased balance  Visit Diagnosis: Acute pain of right knee  Muscle weakness (generalized)  Stiffness of right knee, not elsewhere classified  Swelling of joint of right knee  Other  abnormalities of gait and mobility     Problem List Patient Active Problem List   Diagnosis Date Noted  . Thyroid nodule 06/18/2016  . Moderate persistent asthma 10/29/2014  . Rhinitis 10/29/2014  . GERD (gastroesophageal reflux disease) 10/29/2014  . Intraductal papilloma of breast 05/01/2012   Kerin Perna, PTA 08/04/17 5:26 PM  [place], [place], [address] Phone: [phone]   Fax:  [phone]  Name: [name] MRN: [id number] Date of Birth: 31-Oct-1965

## 2017-08-07 ENCOUNTER — Ambulatory Visit: Payer: Commercial Managed Care - PPO | Admitting: Physical Therapy

## 2017-08-07 DIAGNOSIS — M25461 Effusion, right knee: Secondary | ICD-10-CM | POA: Diagnosis not present

## 2017-08-07 DIAGNOSIS — M25661 Stiffness of right knee, not elsewhere classified: Secondary | ICD-10-CM | POA: Diagnosis not present

## 2017-08-07 DIAGNOSIS — M25561 Pain in right knee: Secondary | ICD-10-CM

## 2017-08-07 DIAGNOSIS — M6281 Muscle weakness (generalized): Secondary | ICD-10-CM

## 2017-08-07 NOTE — Therapy (Signed)
[place], [place], [address] Phone: [phone]   Fax:  [phone]  Physical Therapy Treatment  Patient Details  Name: [name] MRN: [id number] Date of Birth: [birth year] Referring Provider: Dr. Donne Hazel   Encounter Date: 08/07/2017  PT End of Session - 08/07/17 1257    Visit Number  3    Number of Visits  12    Date for PT Re-Evaluation  09/11/17    PT Start Time  1150    PT Stop Time  1245    PT Time Calculation (min)  55 min    Activity Tolerance  Patient tolerated treatment well    Behavior During Therapy  [facility] for tasks assessed/performed       Past Medical History:  Diagnosis Date  . Abnormal Pap smear of cervix 1992   hx cryotherapy to cervix/also abn.pap 2004--Hyst  . Anemia    2015 to present  . Anxiety   . Arthritis    hands, legs  . Cavernous malformation    "indicative of brain lipoma"; is followed at [facility]  . Complication of anesthesia    hard to wake up post-op  . Endometriosis   . Fibroid   . GERD (gastroesophageal reflux disease)   . Headache(784.0)    migraines  . Hypothyroidism    Hx Rt.thyroid nodule--removed 1991 in [place]  . 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 Motion sickness    due to cavernous malformation  . Osteoporosis     Past Surgical History:  Procedure Laterality Date  . BREAST LUMPECTOMY WITH NEEDLE LOCALIZATION Left 05/27/2012   Procedure: BREAST LUMPECTOMY WITH NEEDLE LOCALIZATION;  Surgeon: Imogene Burn. Georgette Dover, MD;  Location: [place];  Service: General;  Laterality: Left;  needle localization at breast center of [place]   . BREAST SURGERY  2014   Left lumpectomy--benign mass  . broken foot Left 02/2015  . CESAREAN SECTION  1985  . CHOLECYSTECTOMY  1994  . EVALUATION UNDER ANESTHESIA WITH ANAL FISTULECTOMY  2011  . OOPHORECTOMY  2004   BSO--endometriosis  . THYROID LOBECTOMY  1990  . TOTAL ABDOMINAL  HYSTERECTOMY  04/20/2002   with left uterolysis/BSO  . TUBAL LIGATION  1991    There were no vitals filed for this visit.  Subjective Assessment - 08/07/17 1155    Subjective  Pt reports she has been off of work all week, which has helped.  She has been icing and using TENS unit 2x/day and falling asleep with ice pack in knee.  She is ambulating without SPC - "today's the first day without it".     Patient Stated Goals  walk without an assistive device    Currently in Pain?  Yes    Pain Score  5     Pain Location  Knee    Pain Orientation  Right    Pain Descriptors / Indicators  Tightness;Aching    Aggravating Factors   sleeping, walking     Pain Relieving Factors  rest, TENS, ice         [facility] PT Assessment - 08/07/17 0001      Assessment   Medical Diagnosis  Rt knee scope    Referring Provider  Dr. Donne Hazel    Onset Date/Surgical Date  07/25/17    Next MD Visit  MD to perform phone call follow ups        [facility] Adult PT Treatment/Exercise - 08/07/17 0001  Knee/Hip Exercises: Stretches   Passive Hamstring Stretch  Right;Left;30 seconds;3 reps    Gastroc Stretch  Right;Left;2 reps;30 seconds      Knee/Hip Exercises: Aerobic   Recumbent Bike  partial revolutions x 5 min       Knee/Hip Exercises: Standing   Lateral Step Up  Right;1 set;10 reps;Hand Hold: 2 3" step    Forward Step Up  Right;1 set;10 reps;Hand Hold: 2 3" step; VC to allow knee to flex on descent.     SLS  3 trials - up to 7 seconds on RLE.       Knee/Hip Exercises: Seated   Long Arc Quad  Strengthening;Right;10 reps;2 sets 10 sec holds    Sit to [employer]  1 set;5 reps;without UE support core engaged.       Knee/Hip Exercises: Supine   Heel Slides  AAROM;Right;5 reps      Knee/Hip Exercises: Prone   Hamstring Curl  1 set;5 reps in between reps of prone hang    Prone Knee Hang  -- 4 reps of up to 30 sec      Modalities   Modalities  Electrical Stimulation;Vasopneumatic      Electrical  Stimulation   Electrical Stimulation Location  Rt knee    Electrical Stimulation Action  IFC    Electrical Stimulation Parameters   to tolerance    Electrical Stimulation Goals  Pain      Vasopneumatic   Number Minutes Vasopneumatic   15 minutes    Vasopnuematic Location   Knee Rt    Vasopneumatic Pressure  Medium    Vasopneumatic Temperature   34 deg      Manual Therapy   Manual Therapy  Taping    Manual therapy comments  Reg rock tape: I strip placed with 20% stretch over Rt shin and another piece over pes anserine to decompress tissue and decrease pain.              PT Education - 08/07/17 1246    Education Details  HEP, ktape info    Person(s) Educated  Patient    Methods  Explanation;Demonstration;Handout    Comprehension  Verbalized understanding;Returned demonstration          PT Long Term Goals - 08/04/17 1718      PT LONG TERM GOAL #1   Title  I with advanced HEP to include a walking program ( 09/11/17)     Time  6    Period  Weeks    Status  On-going      PT LONG TERM GOAL #2   Title  ambulate on even surfaces without an assistive device and minimal knee pain ( 09/11/17)     Time  6    Period  Weeks    Status  On-going      PT LONG TERM GOAL #3   Title  ambulate up/down steps with alternating gait pattern and one rail assist ( 09/11/17)     Time  6    Period  Weeks    Status  On-going      PT LONG TERM GOAL #4   Title  improve FOTO =/< 52% limited (09/11/17)     Time  6    Period  Weeks    Status  On-going      PT LONG TERM GOAL #5   Title  demo Rt knee ROM 0-120 to assist with mobility ( 09/11/17)     Time  6    Period  Weeks    Status  On-going      PT LONG TERM GOAL #6   Title  demo Rt LE strength =/> 4+/5 throughout to allow her to get in/out of her camper and around the campground ( 09/11/17)     Time  6    Period  Weeks    Status  On-going      PT LONG TERM GOAL #7   Title  report Rt knee pain no more than 1-2/10 at the end of a work  shift ( 09/11/17)     Time  6    Period  Weeks    Status  On-going            Plan - 08/07/17 1258    Clinical Impression Statement  Pt presents with improved gait pattern, observed throughout therapy.  Her tolerance for exercise is much improved since last visit.  Progressing towards goals.     Rehab Potential  Good    PT Frequency  2x / week    PT Duration  6 weeks    PT Treatment/Interventions  Iontophoresis 4mg /ml Dexamethasone;Gait training;Stair training;Neuromuscular re-education;Dry needling;Manual techniques;Moist Heat;Taping;Patient/family education;Therapeutic activities;Ultrasound;Therapeutic exercise;Cryotherapy;Electrical Stimulation;Balance training;Passive range of motion;Scar mobilization;Vasopneumatic Device    PT Next Visit Plan  progress Rt knee ROM, LE strengthening, modalities for pain and edema. Respecting pain in Rt foot     Consulted and Agree with Plan of Care  Patient       Patient will benefit from skilled therapeutic intervention in order to improve the following deficits and impairments:  Pain, Increased muscle spasms, Decreased coordination, Decreased range of motion, Decreased strength, Increased edema, Difficulty walking, Decreased balance  Visit Diagnosis: Acute pain of right knee  Stiffness of right knee, not elsewhere classified  Muscle weakness (generalized)  Swelling of joint of right knee     Problem List Patient Active Problem List   Diagnosis Date Noted  . Thyroid nodule 06/18/2016  . Moderate persistent asthma 10/29/2014  . Rhinitis 10/29/2014  . GERD (gastroesophageal reflux disease) 10/29/2014  . Intraductal papilloma of breast 05/01/2012   Kerin Perna, PTA 08/07/17 1:19 PM  [place], [place], [address] Phone: [phone]   Fax:  [phone]  Name: [name] MRN: [id number] Date of Birth: 08-04-1965

## 2017-08-07 NOTE — Patient Instructions (Addendum)
Step-Up: Forward    Leading with right leg, bring both feet onto _4-6___ inch step. Return to starting position, leading with left leg. Repeat __10__ times per session. Do __2__ sessions per day.  Balance: Unilateral    Attempt to balance on left leg, eyes open. Hold _15-30___ seconds. Repeat _2___ times per set. Do __1-2__ sessions per day.  Kinesiology tape What is kinesiology tape?  There are many brands of kinesiology tape.  KTape, [employer], [employer], Dynamic tape, to name a few. It is an elasticized tape designed to support the body's natural healing process. This tape provides stability and support to muscles and joints without restricting motion. It can also help decrease swelling in the area of application. How does it work? The tape microscopically lifts and decompresses the skin to allow for drainage of lymph (swelling) to flow away from area, reducing inflammation.  The tape has the ability to help re-educate the neuromuscular system by targeting specific receptors in the skin.  The presence of the tape increases the body's awareness of posture and body mechanics.  Do not use with: . Open wounds . Skin lesions . Adhesive allergies Safe removal of the tape: In some rare cases, mild/moderate skin irritation can occur.  This can include redness, itchiness, or hives. If this occurs, immediately remove tape and consult your primary care physician if symptoms are severe or do not resolve within 2 days.  To remove tape safely, hold nearby skin with one hand and gentle roll tape down with other hand.  You can apply oil or conditioner to tape while in shower prior to removal to loosen adhesive.  DO NOT swiftly rip tape off like a band-aid, as this could cause skin tears and additional skin irritation.

## 2017-08-11 ENCOUNTER — Encounter: Payer: Self-pay | Admitting: Physical Therapy

## 2017-08-11 ENCOUNTER — Ambulatory Visit: Payer: Commercial Managed Care - PPO | Admitting: Physical Therapy

## 2017-08-11 DIAGNOSIS — R2689 Other abnormalities of gait and mobility: Secondary | ICD-10-CM | POA: Diagnosis not present

## 2017-08-11 DIAGNOSIS — M25561 Pain in right knee: Secondary | ICD-10-CM | POA: Diagnosis not present

## 2017-08-11 DIAGNOSIS — M25661 Stiffness of right knee, not elsewhere classified: Secondary | ICD-10-CM

## 2017-08-11 DIAGNOSIS — M25461 Effusion, right knee: Secondary | ICD-10-CM | POA: Diagnosis not present

## 2017-08-11 DIAGNOSIS — M6281 Muscle weakness (generalized): Secondary | ICD-10-CM

## 2017-08-11 NOTE — Therapy (Signed)
[place], [place], [address] Phone: [phone]   Fax:  [phone]  Physical Therapy Treatment  Patient Details  Name: [name] MRN: [id number] Date of Birth: Nov 28, 1965 Referring Provider: Dr. Donne Hazel   Encounter Date: 08/11/2017  PT End of Session - 08/11/17 1102    Visit Number  4    Number of Visits  12    Date for PT Re-Evaluation  09/11/17    PT Start Time  1102    PT Stop Time  1203    PT Time Calculation (min)  61 min    Activity Tolerance  Patient tolerated treatment well       Past Medical History:  Diagnosis Date  . Abnormal Pap smear of cervix 1992   hx cryotherapy to cervix/also abn.pap 2004--Hyst  . Anemia    2015 to present  . Anxiety   . Arthritis    hands, legs  . Cavernous malformation    "indicative of brain lipoma"; is followed at [facility]  . Complication of anesthesia    hard to wake up post-op  . Endometriosis   . Fibroid   . GERD (gastroesophageal reflux disease)   . Headache(784.0)    migraines  . Hypothyroidism    Hx Rt.thyroid nodule--removed 1991 in [place]  . 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 Motion sickness    due to cavernous malformation  . Osteoporosis     Past Surgical History:  Procedure Laterality Date  . BREAST LUMPECTOMY WITH NEEDLE LOCALIZATION Left 05/27/2012   Procedure: BREAST LUMPECTOMY WITH NEEDLE LOCALIZATION;  Surgeon: Imogene Burn. Georgette Dover, MD;  Location: [place];  Service: General;  Laterality: Left;  needle localization at breast center of [place]   . BREAST SURGERY  2014   Left lumpectomy--benign mass  . broken foot Left 02/2015  . CESAREAN SECTION  1985  . CHOLECYSTECTOMY  1994  . EVALUATION UNDER ANESTHESIA WITH ANAL FISTULECTOMY  2011  . OOPHORECTOMY  2004   BSO--endometriosis  . THYROID LOBECTOMY  1990  . TOTAL ABDOMINAL HYSTERECTOMY  04/20/2002   with left uterolysis/BSO  . TUBAL LIGATION   1991    There were no vitals filed for this visit.  Subjective Assessment - 08/11/17 1102    Subjective  Lezlie has been walking without her cane.  Doing well with her HEP. Had her grandsons over the weekend and she got in the pool. Sees MD onThursday for her foot.  Pt feels like she may be starting to get some shin splints Rt     Patient Stated Goals  walk without an assistive device    Currently in Pain?  Yes    Pain Score  5     Pain Location  Knee    Pain Orientation  Right;Lower;Lateral    Pain Descriptors / Indicators  Aching;Tightness    Pain Type  Surgical pain    Pain Onset  More than a month ago    Pain Frequency  Constant    Aggravating Factors   walking, steps    Pain Relieving Factors  ice and tens         [facility] PT Assessment - 08/11/17 0001      AROM   Right/Left Knee  Right    Right Knee Extension  -4    Right Knee Flexion  119  [place] Adult PT Treatment/Exercise - 08/11/17 0001      Knee/Hip Exercises: Stretches   Active Hamstring Stretch  Right with strap in supine performing 30 reps knee presses      Knee/Hip Exercises: Aerobic   Recumbent Bike  x5' for ROM, making full revolutions now       Knee/Hip Exercises: Standing   Terminal Knee Extension  Strengthening;Right;2 sets;10 reps with ball behind knee    Other Standing Knee Exercises  BWD wt shift onto Rt LE x 20 reps       Knee/Hip Exercises: Seated   Other Seated Knee/Hip Exercises  15 reps Rt LE DF green band       Knee/Hip Exercises: Supine   Bridges  Strengthening;Both;3 sets;10 reps      Modalities   Modalities  Electrical Stimulation;Vasopneumatic;Ultrasound      Acupuncturist Location  Rt knee    Electrical Stimulation Action  IFC    Electrical Stimulation Parameters  to tolerance    Electrical Stimulation Goals  Pain      Ultrasound   Ultrasound Location  medial Rt tibial platuea    Ultrasound Parameters  50%, 1.0w/cm2,  1.41mz    Ultrasound Goals  Pain;Edema      Vasopneumatic   Number Minutes Vasopneumatic   15 minutes    Vasopnuematic Location   Knee    Vasopneumatic Pressure  Medium    Vasopneumatic Temperature   3*      Manual Therapy   Manual Therapy  Taping    Manual therapy comments  Reg rock tape: I strip placed with 20% stretch over Rt shin and another piece over pes anserine to decompress tissue and decrease pain.                   PT Long Term Goals - 08/11/17 1122      PT LONG TERM GOAL #1   Title  I with advanced HEP to include a walking program ( 09/11/17)     Status  On-going      PT LONG TERM GOAL #2   Title  ambulate on even surfaces without an assistive device and minimal knee pain ( 09/11/17)     Status  Partially Met ambulating without device - does have knee pain       PT LONG TERM GOAL #3   Title  ambulate up/down steps with alternating gait pattern and one rail assist ( 09/11/17)     Status  On-going      PT LONG TERM GOAL #4   Title  improve FOTO =/< 52% limited (09/11/17)     Status  On-going      PT LONG TERM GOAL #5   Title  demo Rt knee ROM 0-120 to assist with mobility ( 09/11/17)     Status  On-going      PT LONG TERM GOAL #6   Title  demo Rt LE strength =/> 4+/5 throughout to allow her to get in/out of her camper and around the campground ( 09/11/17)     Status  On-going      PT LONG TERM GOAL #7   Title  report Rt knee pain no more than 1-2/10 at the end of a work shift ( 09/11/17)     Status  On-going            Plan - 08/11/17 1150    Clinical Impression Statement  TJanayahas improved knee ROM, progressing to  her goals.  She had good pain reduction in medial knee after ultrasound.  Walking well without cane in terms of balance, continues to have antalgic gait.  Would benefit from contniued PT to address knee ROM, LE strength, proprioception and gait  Sees MD this week to re-assess her 5th metatarsal fx.    Rehab Potential  Good    PT  Frequency  2x / week    PT Duration  6 weeks    PT Next Visit Plan  progress Rt knee ROM, LE strengthening, modalities for pain and edema. Respecting pain in Rt foot        Patient will benefit from skilled therapeutic intervention in order to improve the following deficits and impairments:  Pain, Increased muscle spasms, Decreased coordination, Decreased range of motion, Decreased strength, Increased edema, Difficulty walking, Decreased balance  Visit Diagnosis: Acute pain of right knee  Stiffness of right knee, not elsewhere classified  Muscle weakness (generalized)  Swelling of joint of right knee  Other abnormalities of gait and mobility     Problem List Patient Active Problem List   Diagnosis Date Noted  . Thyroid nodule 06/18/2016  . Moderate persistent asthma 10/29/2014  . Rhinitis 10/29/2014  . GERD (gastroesophageal reflux disease) 10/29/2014  . Intraductal papilloma of breast 05/01/2012    Jeral Pinch PT  08/11/2017, 11:53 AM  [facility] [place], [place], [address] Phone: [phone]   Fax:  [phone]  Name: CAMYA HAYDON MRN: [id number] Date of Birth: 15-Jun-1965

## 2017-08-14 ENCOUNTER — Encounter: Payer: Commercial Managed Care - PPO | Admitting: Physical Therapy

## 2017-08-15 ENCOUNTER — Encounter: Payer: Commercial Managed Care - PPO | Admitting: Rehabilitative and Restorative Service Providers"

## 2017-08-18 ENCOUNTER — Ambulatory Visit: Payer: Commercial Managed Care - PPO | Admitting: Physical Therapy

## 2017-08-18 ENCOUNTER — Encounter: Payer: Commercial Managed Care - PPO | Admitting: Physical Therapy

## 2017-08-18 DIAGNOSIS — M6281 Muscle weakness (generalized): Secondary | ICD-10-CM

## 2017-08-18 DIAGNOSIS — M25461 Effusion, right knee: Secondary | ICD-10-CM | POA: Diagnosis not present

## 2017-08-18 DIAGNOSIS — M25561 Pain in right knee: Secondary | ICD-10-CM

## 2017-08-18 DIAGNOSIS — M25661 Stiffness of right knee, not elsewhere classified: Secondary | ICD-10-CM | POA: Diagnosis not present

## 2017-08-18 NOTE — Therapy (Addendum)
[place], [place], [address] Phone: [phone]   Fax:  [phone]  Physical Therapy Treatment  Patient Details  Name: GWENN TEODORO MRN: [id number] Date of Birth: 07-26-1965 Referring Provider: Dr. Donne Hazel   Encounter Date: 08/18/2017  PT End of Session - 08/18/17 1443    Visit Number  5    Number of Visits  12    Date for PT Re-Evaluation  09/11/17    PT Start Time  1430    PT Stop Time  1525    PT Time Calculation (min)  55 min    Activity Tolerance  Patient tolerated treatment well    Behavior During Therapy  [facility] for tasks assessed/performed       Past Medical History:  Diagnosis Date  . Abnormal Pap smear of cervix 1992   hx cryotherapy to cervix/also abn.pap 2004--Hyst  . Anemia    2015 to present  . Anxiety   . Arthritis    hands, legs  . Cavernous malformation    "indicative of brain lipoma"; is followed at [facility]  . Complication of anesthesia    hard to wake up post-op  . Endometriosis   . Fibroid   . GERD (gastroesophageal reflux disease)   . Headache(784.0)    migraines  . Hypothyroidism    Hx Rt.thyroid nodule--removed 1991 in [place]  . 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 Motion sickness    due to cavernous malformation  . Osteoporosis     Past Surgical History:  Procedure Laterality Date  . BREAST LUMPECTOMY WITH NEEDLE LOCALIZATION Left 05/27/2012   Procedure: BREAST LUMPECTOMY WITH NEEDLE LOCALIZATION;  Surgeon: Imogene Burn. Georgette Dover, MD;  Location: [place];  Service: General;  Laterality: Left;  needle localization at breast center of [place]   . BREAST SURGERY  2014   Left lumpectomy--benign mass  . broken foot Left 02/2015  . CESAREAN SECTION  1985  . CHOLECYSTECTOMY  1994  . EVALUATION UNDER ANESTHESIA WITH ANAL FISTULECTOMY  2011  . OOPHORECTOMY  2004   BSO--endometriosis  . THYROID LOBECTOMY  1990  . TOTAL ABDOMINAL  HYSTERECTOMY  04/20/2002   with left uterolysis/BSO  . TUBAL LIGATION  1991    There were no vitals filed for this visit.  Subjective Assessment - 08/18/17 1453    Subjective  Pt reports the MD is pleased with foot healing.  She has been doing some stretches at her desk and getting in the pool some.  She is no longer using her cane.     Patient Stated Goals  walk without an assistive device    Currently in Pain?  No/denies    Pain Score  0-No pain         [facility] PT Assessment - 08/18/17 0001      Assessment   Medical Diagnosis  Rt knee scope      AROM   Right/Left Knee  Right    Right Knee Extension  -4    Right Knee Flexion  130      Strength   Right Knee Extension  4/5      Flexibility   Quadriceps  prone knee flex Lt 130, Rt 112      Palpation   Palpation comment  point tender to Rt patellar tendon and pes anserine       [facility] Adult PT Treatment/Exercise - 08/18/17 0001      Knee/Hip Exercises: Stretches  Passive Hamstring Stretch  Right;Left;30 seconds;3 reps    Sports administrator  Right;3 reps;Left;2 reps;30 seconds    Other Knee/Hip Stretches  supine adductor stretch RLE, 3 reps of 30 sec       Knee/Hip Exercises: Aerobic   Nustep  L4: for Rt knee ROM x 5 min       Knee/Hip Exercises: Supine   Bridges  Strengthening;Both;2 sets;10 reps      Knee/Hip Exercises: Sidelying   Hip ABduction  Strengthening;Right;1 set;15 reps      Knee/Hip Exercises: Prone   Hamstring Curl  1 set;5 reps in between reps of prone hang; 1#    Prone Knee Hang  1 minute 2 reps      Ultrasound   Ultrasound Location  Rt pes anserine     Ultrasound Parameters  50%, 1.0 w/cm2, 1.0 mHz      Vasopneumatic   Number Minutes Vasopneumatic   15 minutes    Vasopnuematic Location   Knee Rt    Vasopneumatic Pressure  Medium    Vasopneumatic Temperature   34 deg      Manual Therapy   Manual therapy comments  Reg rock tape: I strip placed with 20% stretch over Rt shin and another piece over pes  anserine to decompress tissue and decrease pain.                   PT Long Term Goals - 08/11/17 1122      PT LONG TERM GOAL #1   Title  I with advanced HEP to include a walking program ( 09/11/17)     Status  On-going      PT LONG TERM GOAL #2   Title  ambulate on even surfaces without an assistive device and minimal knee pain ( 09/11/17)     Status  Partially Met ambulating without device - does have knee pain       PT LONG TERM GOAL #3   Title  ambulate up/down steps with alternating gait pattern and one rail assist ( 09/11/17)     Status  On-going      PT LONG TERM GOAL #4   Title  improve FOTO =/< 52% limited (09/11/17)     Status  On-going      PT LONG TERM GOAL #5   Title  demo Rt knee ROM 0-120 to assist with mobility ( 09/11/17)     Status  On-going      PT LONG TERM GOAL #6   Title  demo Rt LE strength =/> 4+/5 throughout to allow her to get in/out of her camper and around the campground ( 09/11/17)     Status  On-going      PT LONG TERM GOAL #7   Title  report Rt knee pain no more than 1-2/10 at the end of a work shift ( 09/11/17)     Status  On-going            Plan - 08/18/17 1528    Clinical Impression Statement  She continues to have shin splints on Rt, as well as tender to touch at patellar tendon and pes anserine.  Pt demonstrated improved Rt knee flexion ROM, however still lacks 4 deg of extension.  Hip abdct strength improving.  Pt has partially met LTG #5, Progressing towards goals.     Rehab Potential  Good    PT Frequency  2x / week    PT Duration  6 weeks  PT Treatment/Interventions  Iontophoresis 26m/ml Dexamethasone;Gait training;Stair training;Neuromuscular re-education;Dry needling;Manual techniques;Moist Heat;Taping;Patient/family education;Therapeutic activities;Ultrasound;Therapeutic exercise;Cryotherapy;Electrical Stimulation;Balance training;Passive range of motion;Scar mobilization;Vasopneumatic Device    PT Next Visit Plan   progress Rt knee ROM, LE strengthening, modalities for pain and edema. Respecting pain in Rt foot     Consulted and Agree with Plan of Care  Patient       Patient will benefit from skilled therapeutic intervention in order to improve the following deficits and impairments:  Pain, Increased muscle spasms, Decreased coordination, Decreased range of motion, Decreased strength, Increased edema, Difficulty walking, Decreased balance  Visit Diagnosis: Acute pain of right knee  Stiffness of right knee, not elsewhere classified  Muscle weakness (generalized)  Swelling of joint of right knee     Problem List Patient Active Problem List   Diagnosis Date Noted  . Thyroid nodule 06/18/2016  . Moderate persistent asthma 10/29/2014  . Rhinitis 10/29/2014  . GERD (gastroesophageal reflux disease) 10/29/2014  . Intraductal papilloma of breast 05/01/2012   JKerin Perna PTA 08/18/17 3:41 PM  [facility] [place] [address] [address]Phone: [phone]  Fax:  [phone] Name: TLAIRA PENNINGERMRN: [id number] of Birth: 224-Dec-1967  PHYSICAL THERAPY DISCHARGE SUMMARY  Visits from Start of Care: 5  Current functional level related to goals / functional outcomes: See last progress note for discharge status    Remaining deficits: Unknown    Education / Equipment: HEP  Plan: Patient agrees to discharge.  Patient goals were partially met. Patient is being discharged due to not returning since the last visit.  ?????    Celyn P. HHelene KelpPT, MPH 09/18/17 2:35 PM

## 2017-08-20 ENCOUNTER — Encounter

## 2017-08-20 ENCOUNTER — Ambulatory Visit: Payer: Commercial Managed Care - PPO | Admitting: Obstetrics and Gynecology

## 2017-08-22 ENCOUNTER — Encounter: Payer: Commercial Managed Care - PPO | Admitting: Rehabilitative and Restorative Service Providers"

## 2017-12-15 ENCOUNTER — Emergency Department: Payer: Commercial Managed Care - PPO

## 2017-12-15 ENCOUNTER — Other Ambulatory Visit: Payer: Self-pay

## 2017-12-15 ENCOUNTER — Encounter: Payer: Self-pay | Admitting: Intensive Care

## 2017-12-15 ENCOUNTER — Emergency Department
Admission: EM | Admit: 2017-12-15 | Discharge: 2017-12-15 | Disposition: A | Discharge status: Home / Self Care | Payer: Commercial Managed Care - PPO | Attending: Emergency Medicine | Admitting: Emergency Medicine

## 2017-12-15 ENCOUNTER — Other Ambulatory Visit
Admission: RE | Admit: 2017-12-15 | Discharge: 2017-12-15 | Disposition: A | Discharge status: Home / Self Care | Payer: Commercial Managed Care - PPO | Source: Ambulatory Visit | Attending: Family Medicine | Admitting: Family Medicine

## 2017-12-15 DIAGNOSIS — Z79899 Other long term (current) drug therapy: Secondary | ICD-10-CM | POA: Insufficient documentation

## 2017-12-15 DIAGNOSIS — Z9049 Acquired absence of other specified parts of digestive tract: Secondary | ICD-10-CM | POA: Insufficient documentation

## 2017-12-15 DIAGNOSIS — R0781 Pleurodynia: Secondary | ICD-10-CM

## 2017-12-15 DIAGNOSIS — R0789 Other chest pain: Secondary | ICD-10-CM | POA: Diagnosis not present

## 2017-12-15 DIAGNOSIS — R079 Chest pain, unspecified: Secondary | ICD-10-CM | POA: Diagnosis present

## 2017-12-15 DIAGNOSIS — Z87891 Personal history of nicotine dependence: Secondary | ICD-10-CM | POA: Diagnosis not present

## 2017-12-15 DIAGNOSIS — F419 Anxiety disorder, unspecified: Secondary | ICD-10-CM | POA: Insufficient documentation

## 2017-12-15 DIAGNOSIS — E039 Hypothyroidism, unspecified: Secondary | ICD-10-CM | POA: Insufficient documentation

## 2017-12-15 DIAGNOSIS — R0602 Shortness of breath: Secondary | ICD-10-CM | POA: Diagnosis not present

## 2017-12-15 HISTORY — DX: Unspecified asthma, uncomplicated: J45.909

## 2017-12-15 LAB — CBC
HCT: 36.2 % (ref 36.0–46.0)
Hemoglobin: 11.4 g/dL — ABNORMAL LOW (ref 12.0–15.0)
MCH: 26.9 pg (ref 26.0–34.0)
MCHC: 31.5 g/dL (ref 30.0–36.0)
MCV: 85.4 fL (ref 80.0–100.0)
PLATELETS: 306 10*3/uL (ref 150–400)
RBC: 4.24 MIL/uL (ref 3.87–5.11)
RDW: 15.9 % — AB (ref 11.5–15.5)
WBC: 10 10*3/uL (ref 4.0–10.5)
nRBC: 0 % (ref 0.0–0.2)

## 2017-12-15 LAB — BASIC METABOLIC PANEL
ANION GAP: 8 (ref 5–15)
BUN: 14 mg/dL (ref 6–20)
CALCIUM: 9.3 mg/dL (ref 8.9–10.3)
CO2: 23 mmol/L (ref 22–32)
CREATININE: 0.85 mg/dL (ref 0.44–1.00)
Chloride: 110 mmol/L (ref 98–111)
GFR calc Af Amer: >60 mL/min (ref >60)
GFR calc non Af Amer: >60 mL/min (ref >60)
Glucose, Bld: 115 mg/dL — ABNORMAL HIGH (ref 70–99)
Potassium: 4.1 mmol/L (ref 3.5–5.1)
SODIUM: 141 mmol/L (ref 135–145)

## 2017-12-15 LAB — FIBRIN DERIVATIVES D-DIMER (ARMC ONLY)
FIBRIN DERIVATIVES D-DIMER (ARMC): 650.65 ng{FEU}/mL — AB (ref 0.00–499.00)
Fibrin derivatives D-dimer (ARMC): 663.85 ng/mL (FEU) — ABNORMAL HIGH (ref 0.00–499.00)

## 2017-12-15 LAB — TROPONIN I

## 2017-12-15 MED ORDER — KETOROLAC TROMETHAMINE 30 MG/ML IJ SOLN
30.0000 mg | Freq: Once | INTRAMUSCULAR | Status: AC
  Administered 2017-12-15: 30 mg via INTRAVENOUS
  Filled 2017-12-15: qty 1

## 2017-12-15 MED ORDER — IOHEXOL 350 MG/ML SOLN
75.0000 mL | Freq: Once | INTRAVENOUS | Status: AC | PRN
  Administered 2017-12-15: 75 mL via INTRAVENOUS
  Filled 2017-12-15: qty 75

## 2017-12-15 NOTE — Discharge Instructions (Addendum)
Please seek medical attention for any high fevers, chest pain, shortness of breath, change in behavior, persistent vomiting, bloody stool or any other new or concerning symptoms.  

## 2017-12-15 NOTE — ED Notes (Signed)
Patient transported to CT 

## 2017-12-15 NOTE — ED Triage Notes (Signed)
PAtient reports she was bending over and felt her rib pop saturday and has been SOB since. Was seen at [facility] today and had results of D-dimer 663. Clinic called and told patient to come here. A&O x4. Ambulatory in triage with no problems

## 2017-12-15 NOTE — ED Provider Notes (Signed)
[facility] Emergency Department Provider Note   ____________________________________________   I have reviewed the triage vital signs and the nursing notes.   HISTORY  Chief Complaint Rib Injury and Shortness of Breath   History limited by: Not Limited   HPI [name] is a 52 y.o. female who presents to the emergency department today because of concerns for right sided chest pain and elevated d-dimer.  Patient states that she first developed some right-sided chest pain 2 days ago.  She was bending over when she felt a popping sensation in the right side of her chest.  Since that time she has had pain to that area.  Pain is worse with movements.  Patient went to urgent care where work-up was performed including a d-dimer.  D-dimer came back elevated so she was instructed to come to the emergency department.  She does state that she recently had a prolonged car rides.   Per medical record review patient has a history of anemia  Past Medical History:  Diagnosis Date  . Abnormal Pap smear of cervix 1992   hx cryotherapy to cervix/also abn.pap 2004--Hyst  . Anemia    2015 to present  . Anxiety   . Arthritis    hands, legs  . Cavernous malformation    "indicative of brain lipoma"; is followed at [facility]  . Complication of anesthesia    hard to wake up post-op  . Endometriosis   . Fibroid   . GERD (gastroesophageal reflux disease)   . Headache(784.0)    migraines  . Hypothyroidism    Hx Rt.thyroid nodule--removed 1991 in [place]  . 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 Motion sickness    due to cavernous malformation  . Osteoporosis     Patient Active Problem List   Diagnosis Date Noted  . Thyroid nodule 06/18/2016  . Moderate persistent asthma 10/29/2014  . Rhinitis 10/29/2014  . GERD (gastroesophageal reflux disease) 10/29/2014  . Intraductal papilloma of breast 05/01/2012    Past Surgical History:  Procedure Laterality Date  .  BREAST LUMPECTOMY WITH NEEDLE LOCALIZATION Left 05/27/2012   Procedure: BREAST LUMPECTOMY WITH NEEDLE LOCALIZATION;  Surgeon: Imogene Burn. Georgette Dover, MD;  Location: [place];  Service: General;  Laterality: Left;  needle localization at breast center of [place]   . BREAST SURGERY  2014   Left lumpectomy--benign mass  . broken foot Left 02/2015  . CESAREAN SECTION  1985  . CHOLECYSTECTOMY  1994  . EVALUATION UNDER ANESTHESIA WITH ANAL FISTULECTOMY  2011  . OOPHORECTOMY  2004   BSO--endometriosis  . THYROID LOBECTOMY  1990  . TOTAL ABDOMINAL HYSTERECTOMY  04/20/2002   with left uterolysis/BSO  . TUBAL LIGATION  1991    Prior to Admission medications   Medication Sig Start Date End Date Taking? Authorizing Provider  Albuterol Sulfate (PROAIR RESPICLICK) 408 (90 Base) MCG/ACT AEPB Inhale 2 puffs into the lungs every 6 (six) hours as needed. 03/26/16   Kozlow, Donnamarie Poag, MD  doxycycline (DORYX) 100 MG EC tablet Take 100 mg by mouth daily.    [provider]  folic acid (FOLVITE) 144 MCG tablet Take 400 mcg by mouth daily.    [provider]  Ginger, Zingiber officinalis, (GINGER ROOT) 250 MG CAPS Take 1 tablet by mouth daily.    [provider]  L-Lysine 500 MG CAPS Take 1 capsule by mouth daily.    [provider]  levocetirizine (XYZAL) 5 MG tablet Take  5 mg by mouth at bedtime. 09/06/15   [provider]  levothyroxine (SYNTHROID, LEVOTHROID) 50 MCG tablet Take 1 tablet (50 mcg total) by mouth daily. Patient not taking: Reported on 07/31/2017 09/27/15   Nunzio Cobbs, MD  Multiple Vitamin (MULTIVITAMIN) tablet Take 1 tablet by mouth daily.    [provider]  naproxen sodium (ANAPROX) 220 MG tablet Take 220 mg by mouth as needed.     [provider]  omeprazole (PRILOSEC) 40 MG capsule TAKE 1 CAPSULE (40 MG TOTAL) BY MOUTH DAILY. 03/24/17   Kozlow, Donnamarie Poag, MD  PULMICORT FLEXHALER 180 MCG/ACT inhaler INHALE 2 PUFFS INTO THE  LUNGS DAILY. 03/24/17   Kozlow, Donnamarie Poag, MD  ranitidine (ZANTAC) 150 MG tablet Take 2 tablets (300 mg total) by mouth at bedtime. Patient taking differently: Take 300 mg by mouth as needed.  03/26/16   Kozlow, Donnamarie Poag, MD  sertraline (ZOLOFT) 100 MG tablet TAKE 1 TABLET BY MOUTH EVERY DAY 04/02/17   Yisroel Ramming, Brook E, MD  triamcinolone cream (KENALOG) 0.1 % APPLY 2-3 TIMES DAILY AS NEEDED FOR ITCHY AREAS FOR 2-3 WEEKS 03/08/16   [provider]  VIMOVO 500-20 MG TBEC Take 1 tablet by mouth as needed.  03/23/16   [provider]    Allergies Sesame oil; Sulfa antibiotics; and Sulfamethoxazole  Family History  Problem Relation Age of Onset  . Cancer Mother        kidney  . Anesthesia problems Mother        post-op N/V  . Cancer Father        melanoma  . Hypertension Father   . Diabetes Maternal Grandmother   . Goiter Paternal Aunt     Social History Social History   Tobacco Use  . Smoking status: Former Smoker    Types: Cigarettes    Last attempt to quit: 11/18/2009    Years since quitting: 8.0  . Smokeless tobacco: Never Used  Substance Use Topics  . Alcohol use: No  . Drug use: No    Review of Systems Constitutional: No fever/chills Eyes: No visual changes. ENT: No sore throat. Cardiovascular: Positive for right sided chest pain. Respiratory: Positive for shortness of breath. Gastrointestinal: No abdominal pain.  No nausea, no vomiting.  No diarrhea.   Genitourinary: Negative for dysuria. Musculoskeletal: Negative for back pain. Skin: Negative for rash. Neurological: Negative for headaches, focal weakness or numbness.  ____________________________________________   PHYSICAL EXAM:  VITAL SIGNS: ED Triage Vitals  Enc Vitals Group     BP 12/15/17 1814 (!) 157/65     Pulse Rate 12/15/17 1814 76     Resp 12/15/17 1814 18     Temp 12/15/17 1814 98.2 F (36.8 C)     Temp Source 12/15/17 1814 Oral     SpO2 12/15/17 1814 98 %     Weight 12/15/17  1815 222 lb (100.7 kg)     Height 12/15/17 1815 5' 2.5" (1.588 m)     Head Circumference --      Peak Flow --      Pain Score 12/15/17 1815 5   Constitutional: Alert and oriented.  Eyes: Conjunctivae are normal.  ENT      Head: Normocephalic and atraumatic.      Nose: No congestion/rhinnorhea.      Mouth/Throat: Mucous membranes are moist.      Neck: No stridor. Hematological/Lymphatic/Immunilogical: No cervical lymphadenopathy. Cardiovascular: Normal rate, regular rhythm.  No murmurs, rubs, or  gallops.  Respiratory: Normal respiratory effort without tachypnea nor retractions. Breath sounds are clear and equal bilaterally. No wheezes/rales/rhonchi. Gastrointestinal: Soft and non tender. No rebound. No guarding.  Genitourinary: Deferred Musculoskeletal: Normal range of motion in all extremities. No lower extremity edema. Neurologic:  Normal speech and language. No gross focal neurologic deficits are appreciated.  Skin:  Skin is warm, dry and intact. No rash noted. Psychiatric: Mood and affect are normal. Speech and behavior are normal. Patient exhibits appropriate insight and judgment.  ____________________________________________    LABS (pertinent positives/negatives)  D-dimer 650.65 Trop <0.03 CBC wbc 10.0, hgb 11.4, plt 306 BMP wnl except 115  ____________________________________________   EKG  I, Nance Pear, attending physician, personally viewed and interpreted this EKG  EKG Time: 1817 Rate: 75 Rhythm: normal sinus rhythm Axis: normal Intervals: qtc 477 QRS: narrow ST changes: no st elevation Impression: normal ekg   ____________________________________________    RADIOLOGY  CXR No acute disease  ____________________________________________   PROCEDURES  Procedures  ____________________________________________   INITIAL IMPRESSION / ASSESSMENT AND PLAN / ED COURSE  Pertinent labs & imaging results that were available during my care of  the patient were reviewed by me and considered in my medical decision making (see chart for details).   Patient presented to the emergency department today because of concerns for elevated d-dimer and right-sided chest pain.  She does have risk factor for DVT with prolonged travel recently.  Given elevated d-dimer CT angios PE was ordered.  This did not show any PE nor did it show obvious etiology of the patient's pain.  Given that it is worse with movement and negative CT scan do wonder if musculoskeletal etiology is the cause of the symptoms.  Discussed this with the patient. She stated she had been prescribed muscle relaxers and naprosyn by previous doctor.  ____________________________________________   FINAL CLINICAL IMPRESSION(S) / ED DIAGNOSES  Final diagnoses:  Shortness of breath  Right-sided chest wall pain     Note: This dictation was prepared with Dragon dictation. Any transcriptional errors that result from this process are unintentional     Nance Pear, MD 12/16/17 1821

## 2018-09-05 IMAGING — US US THYROID
1 series · 13 of 25 positions shown · non-contrast
Comparison: New

CLINICAL DATA: Patient states she has a lump in the Rt neck and had
her Lt thyroid lobe removed in her 20's due to a goiter. Upon
scanning the Rt lobe was actually the side removed. No prior scans
available

EXAM:
THYROID ULTRASOUND
TECHNIQUE: Ultrasound examination of the thyroid gland and adjacent soft
tissues was performed.

[Series 1: us thyroid · 0.06mm/px · 13 of 30 slices shown]
[im 1/30]
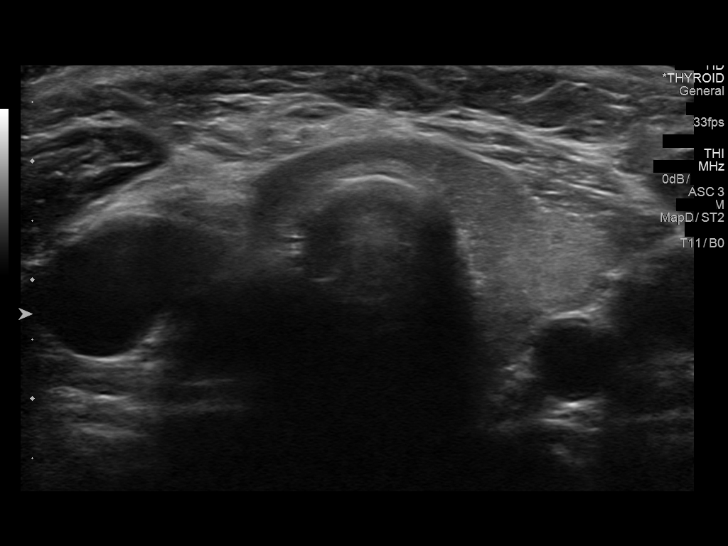
[im 3/30]
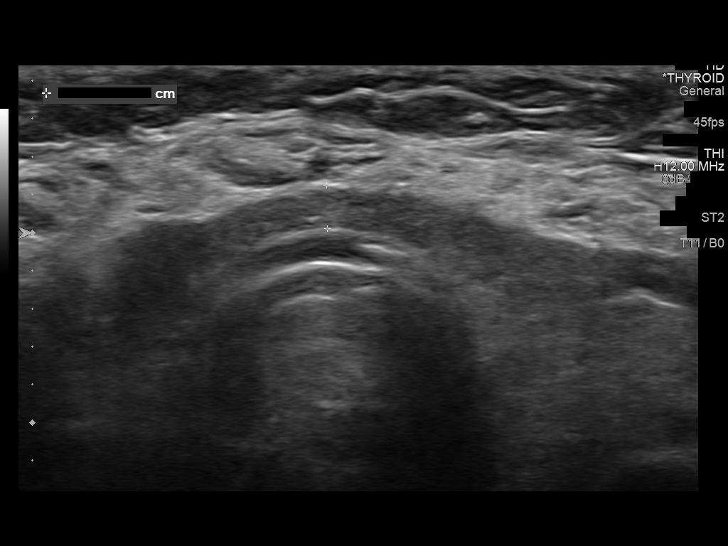
[im 5/30]
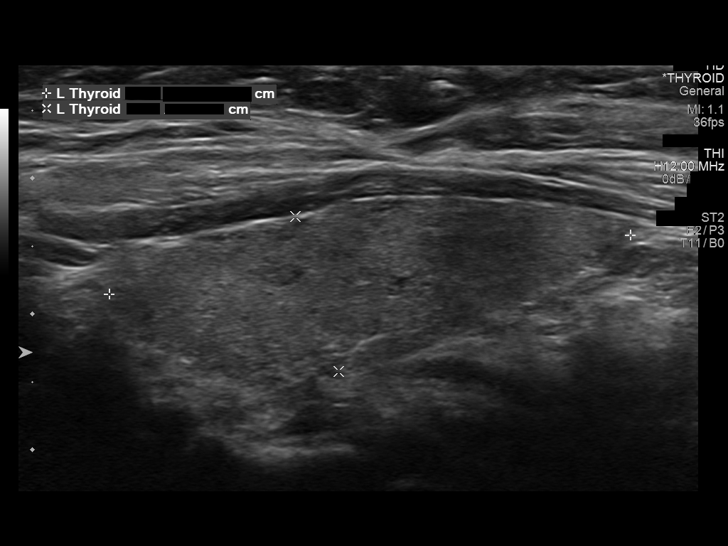
[im 8/30]
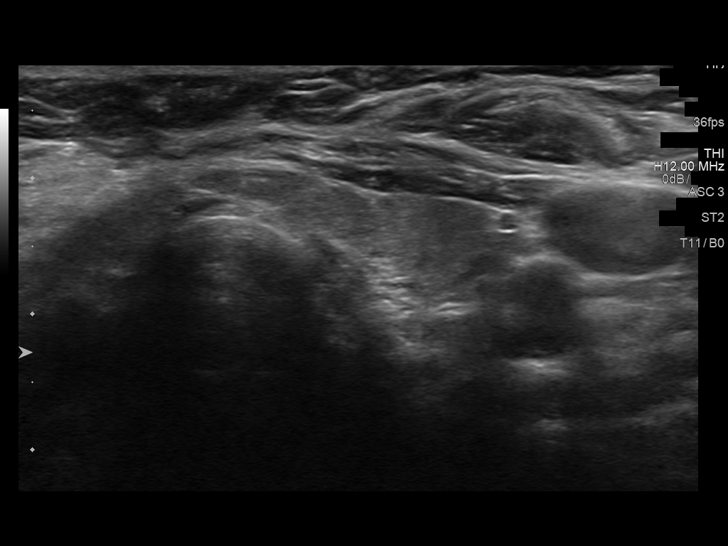
[im 10/30]
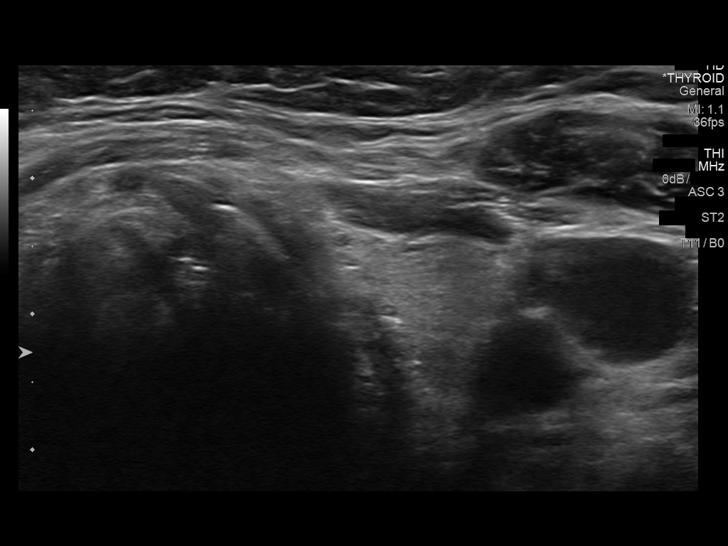
[im 13/30]
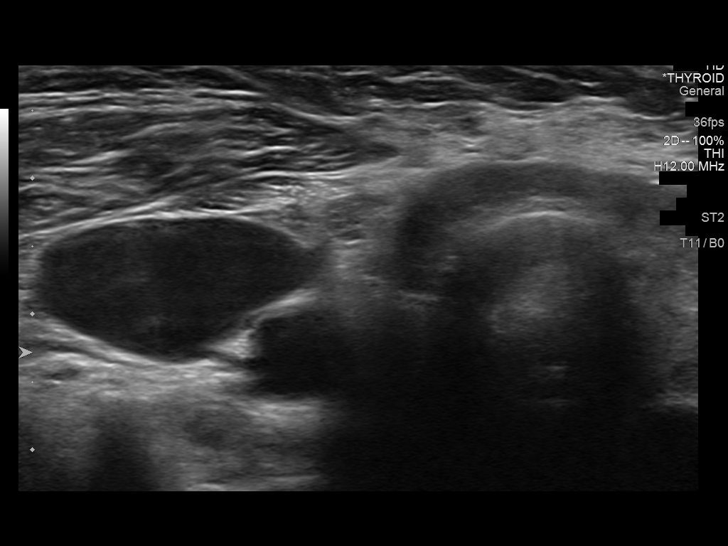
[im 15/30]
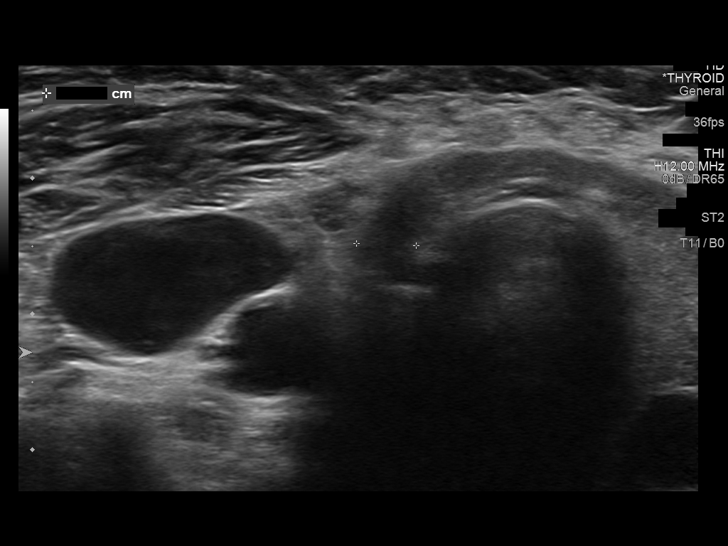
[im 17/30]
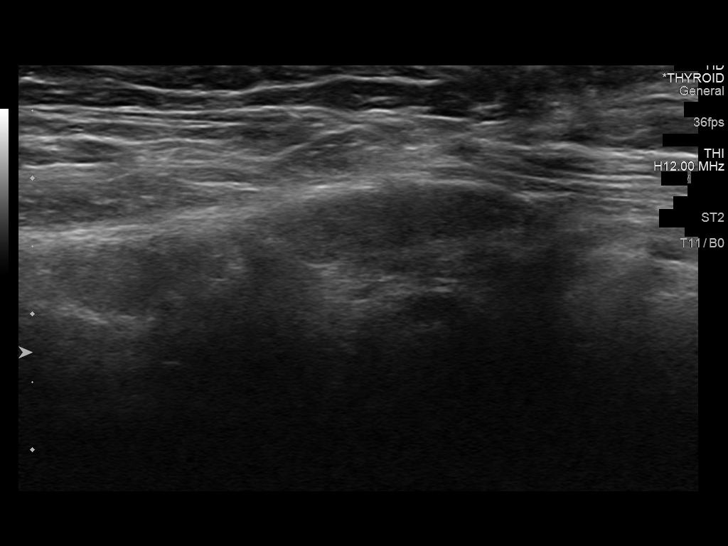
[im 20/30]
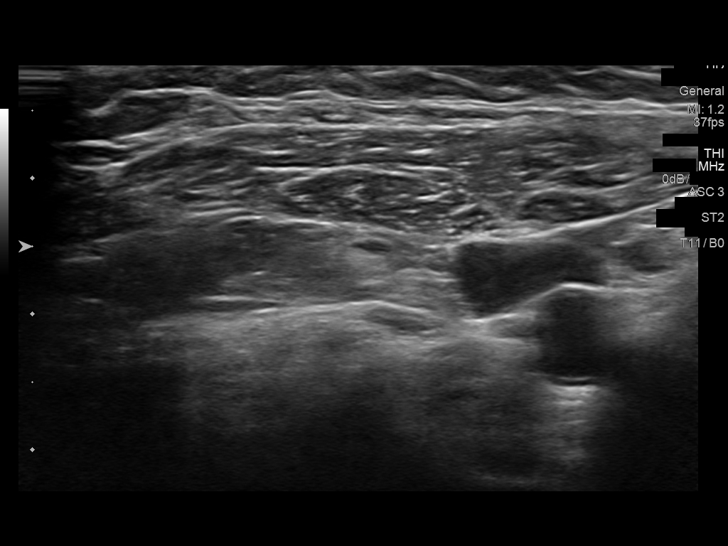
[im 22/30]
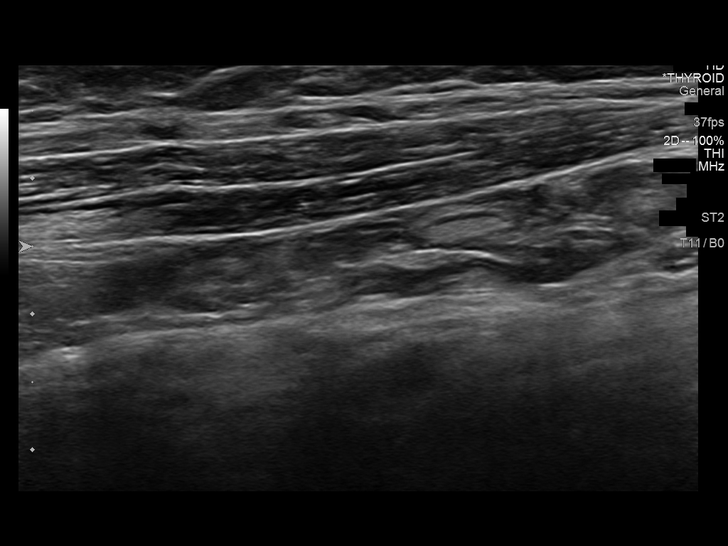
[im 25/30]
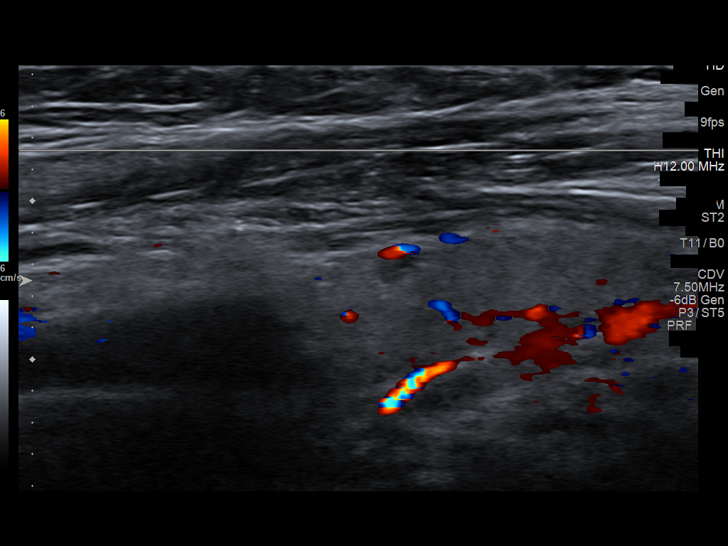
[im 27/30]
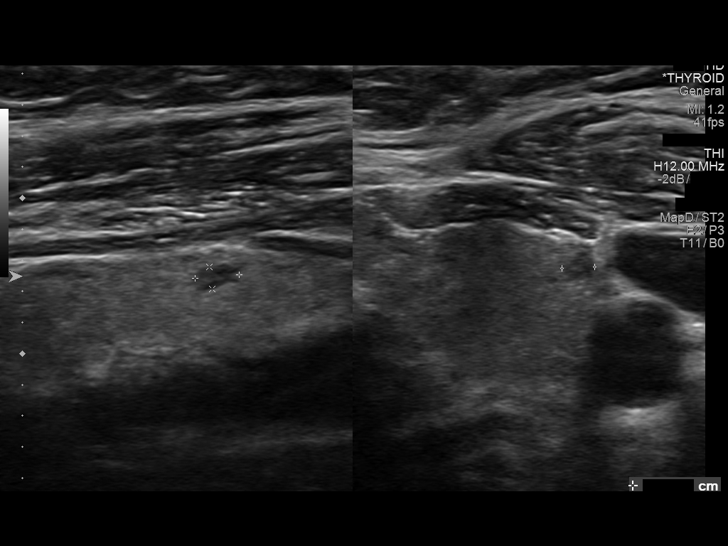
[im 30/30]
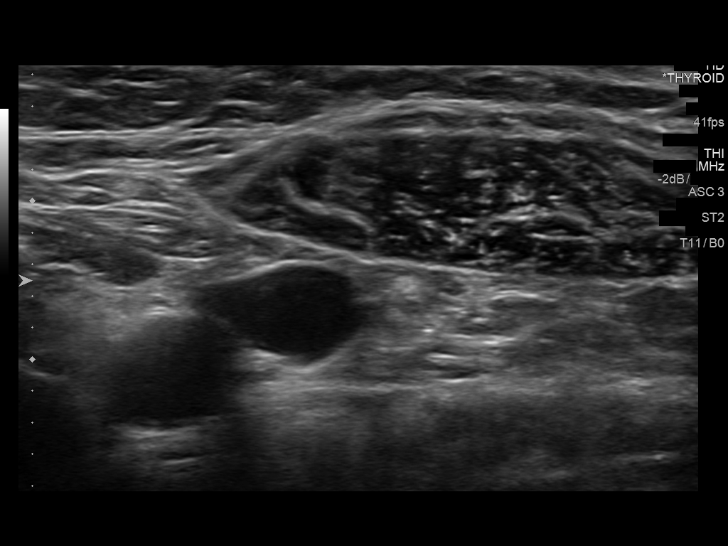

[13 of 25 positions shown; findings below may reference images not displayed]

FINDINGS: Parenchymal Echotexture: Mildly heterogenous

Isthmus: 0.2 cm thickness

Right lobe: Postop changes, with 1.8 x 0.4 x 0.5 cm
residual/recurrent tissue contiguous with the isthmus.

Left lobe: 3.9 x 1.2 x 1.5 cm

_________________________________________________________

Estimated total number of nodules >/= 1 cm:

Number of spongiform nodules >/=  2 cm not described below (TR1):

Number of mixed cystic and solid nodules >/= 1.5 cm not described
below (TR2):

0.3 cm hypoechoic nodule, mid left lobe.

In the region of clinical concern in the right neck, there is no
evidence of mass,, cyst, aneurysm, abscess, or adenopathy.
IMPRESSION: 1. Changes of partial right thyroid lobectomy.
2. Single small left nodule, does not meet criteria for biopsy or
dedicated imaging follow-up.

The above is in keeping with the ACR TI-RADS recommendations - [HOSPITAL] 3245;[DATE].

## 2018-09-26 IMAGING — DX DG CHEST 2V
2 series · 2 of 2 positions shown · non-contrast
Comparison: 06/17/2013

CLINICAL DATA: Erythema induratum

EXAM:
CHEST  2 VIEW

[chest pa]
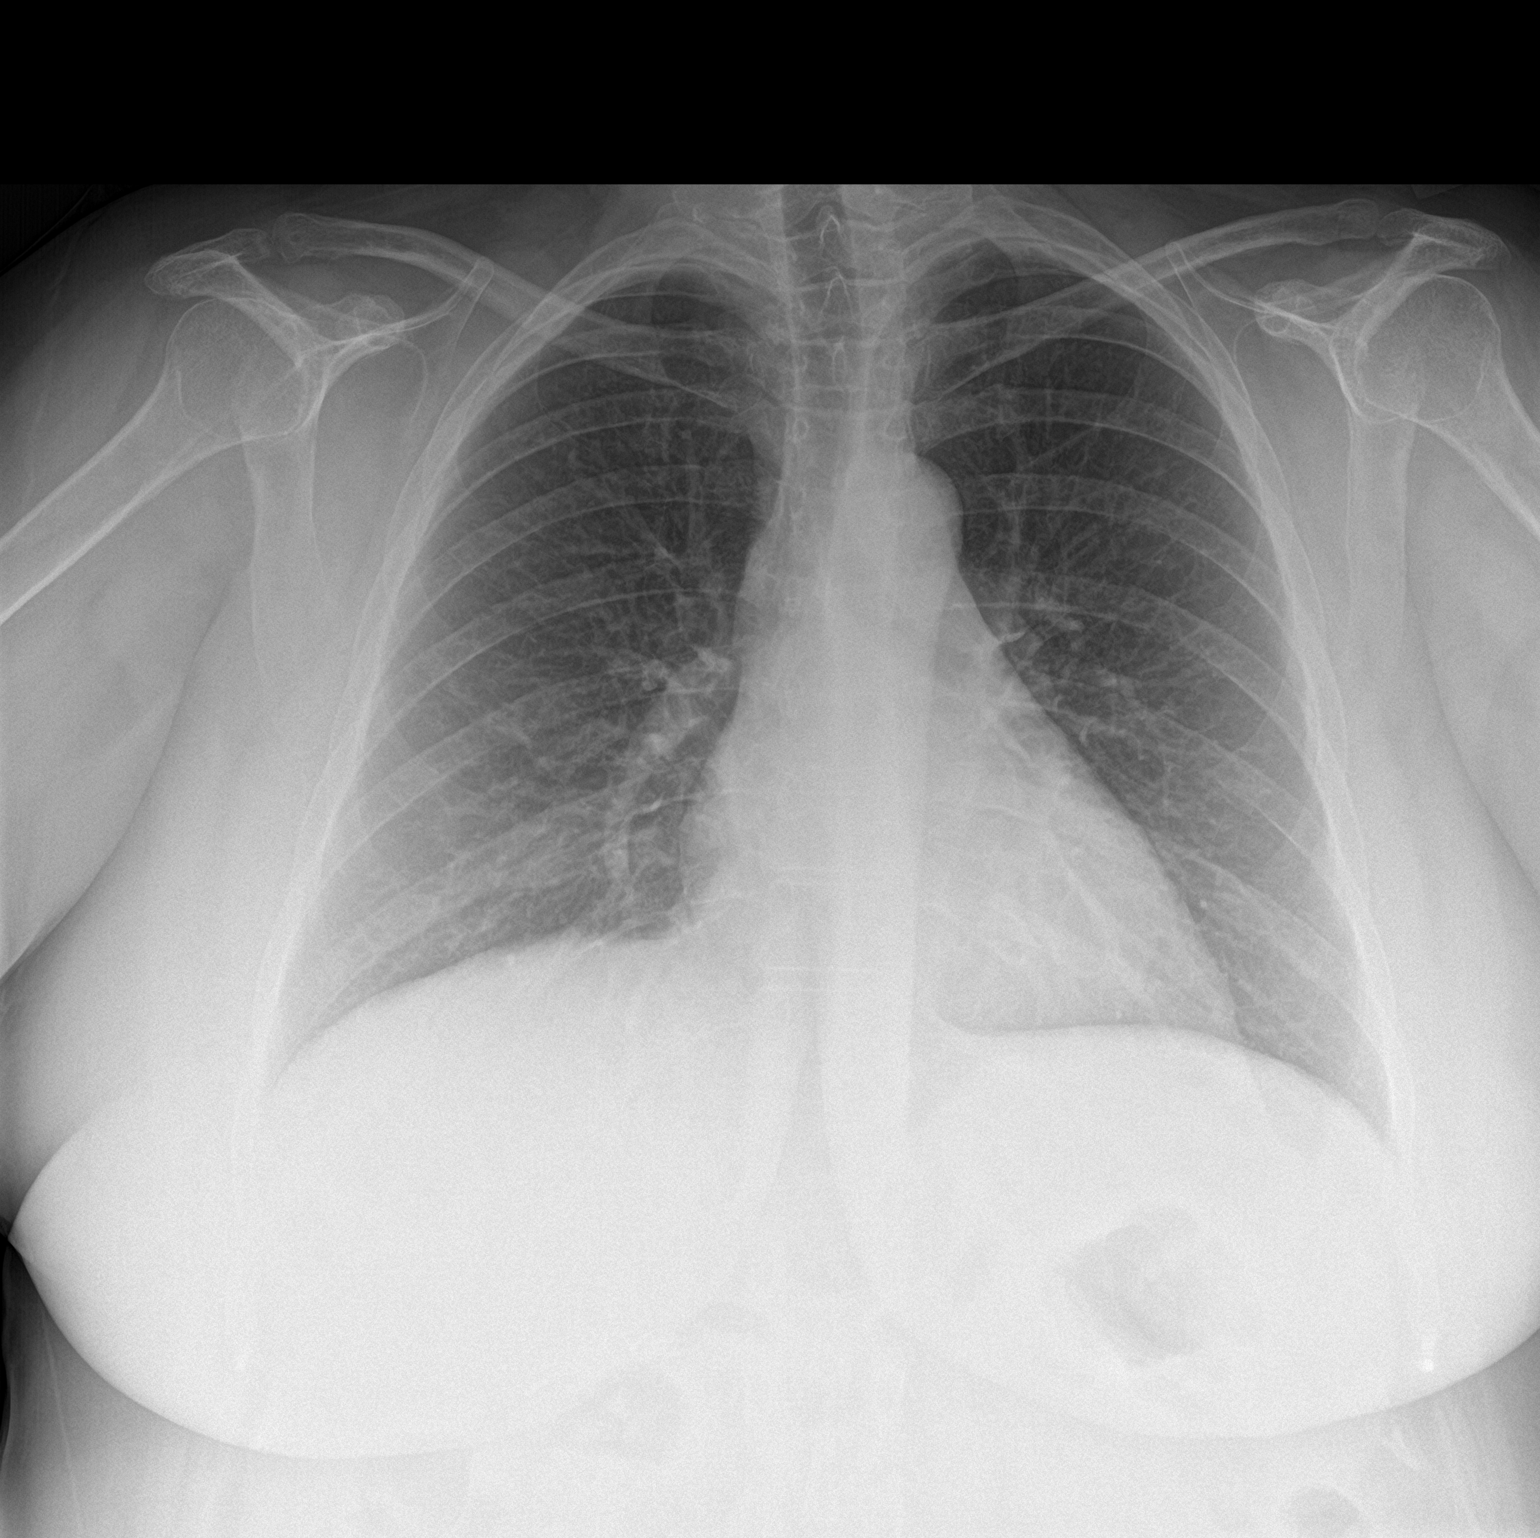

[chest lat]
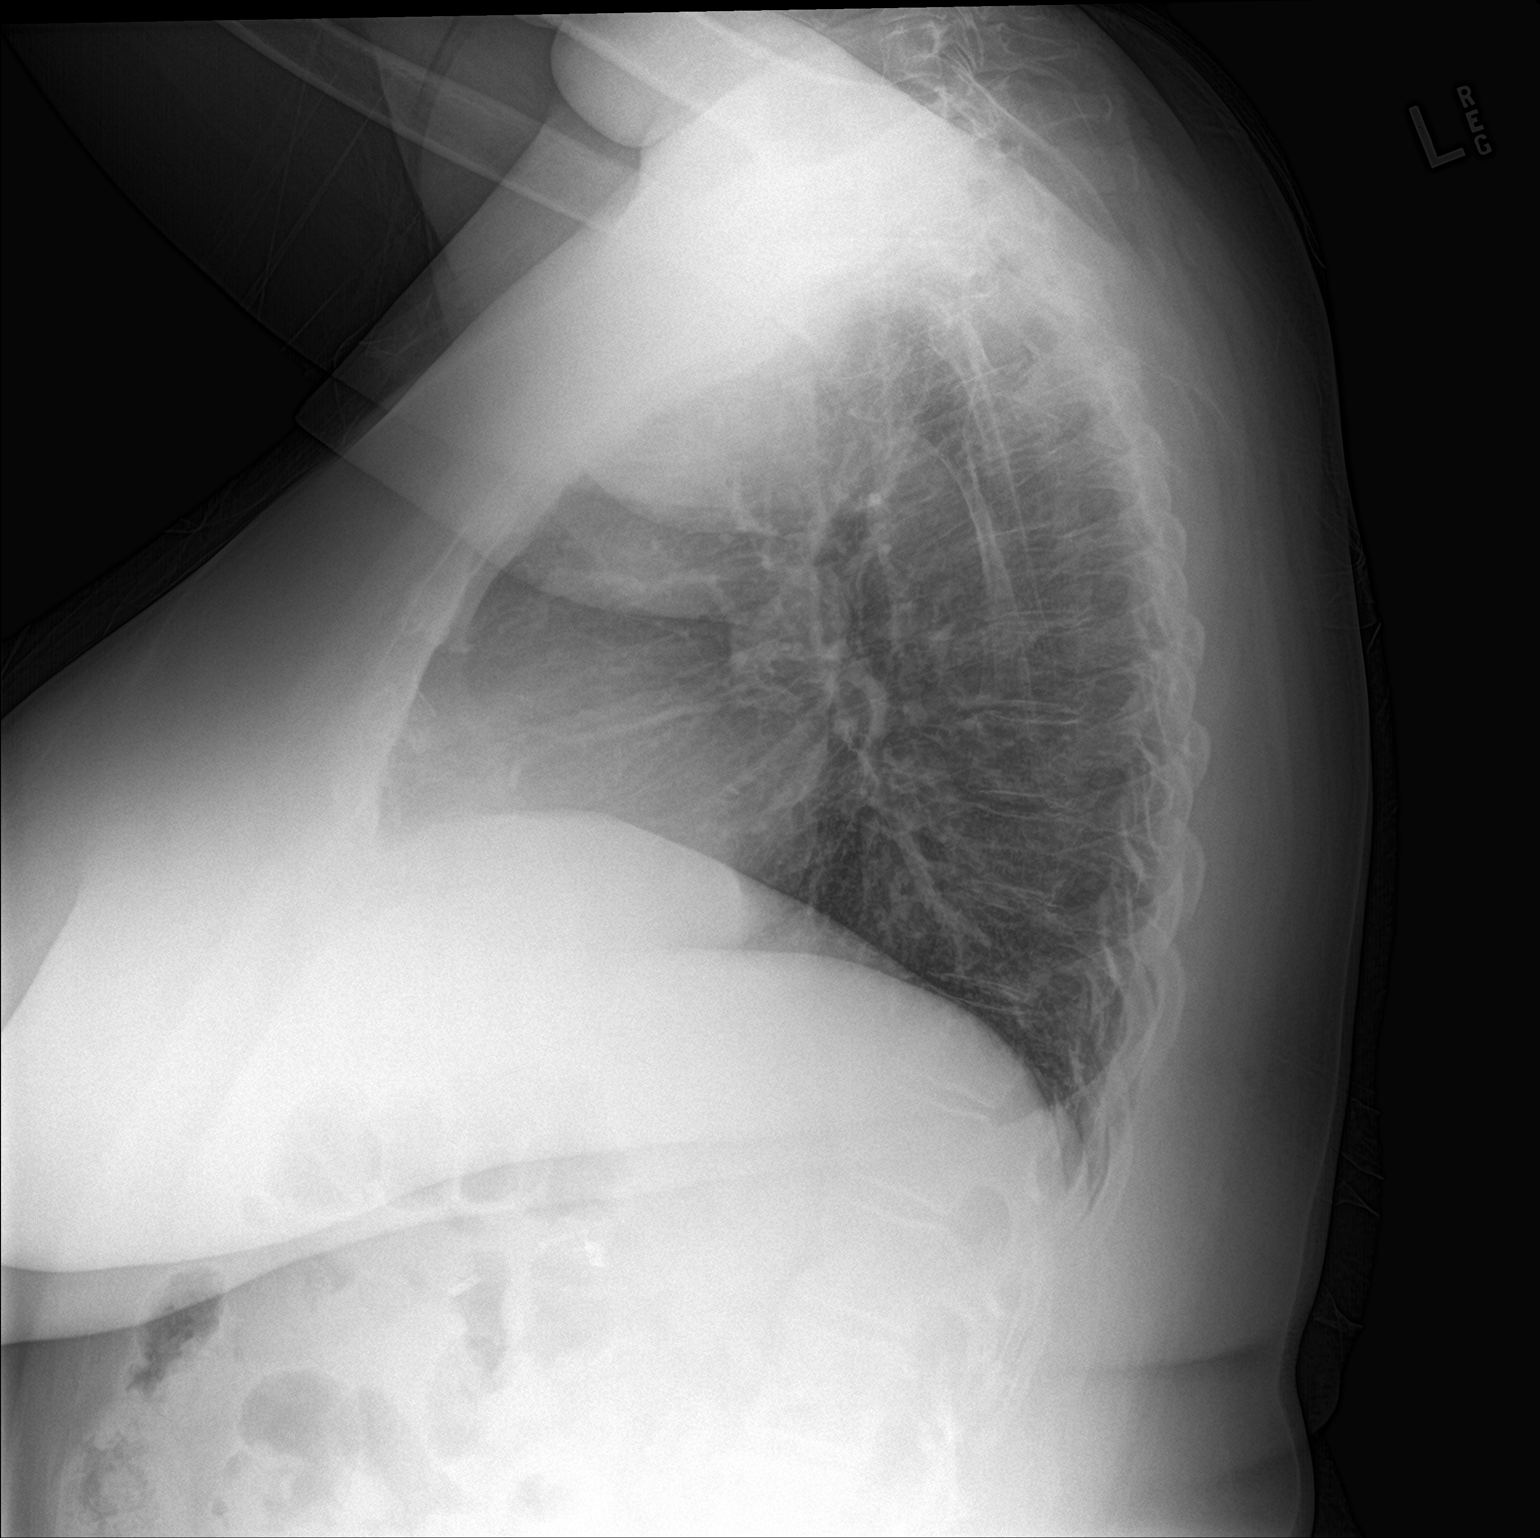

[2 of 2 positions shown; findings below may reference images not displayed]

FINDINGS: The heart size and mediastinal contours are within normal limits.
Both lungs are clear. The visualized skeletal structures are
unremarkable.
IMPRESSION: No active cardiopulmonary disease.

## 2018-12-14 ENCOUNTER — Encounter (HOSPITAL_COMMUNITY): Payer: Self-pay

## 2018-12-14 ENCOUNTER — Other Ambulatory Visit: Payer: Self-pay

## 2018-12-14 ENCOUNTER — Ambulatory Visit (HOSPITAL_COMMUNITY)
Admission: EM | Admit: 2018-12-14 | Discharge: 2018-12-14 | Disposition: A | Discharge status: Home / Self Care | Payer: Commercial Managed Care - PPO | Attending: Family Medicine | Admitting: Family Medicine

## 2018-12-14 DIAGNOSIS — M545 Low back pain, unspecified: Secondary | ICD-10-CM

## 2018-12-14 LAB — POCT URINALYSIS DIP (DEVICE)
Bilirubin Urine: NEGATIVE
Glucose, UA: NEGATIVE mg/dL
Hgb urine dipstick: NEGATIVE
Ketones, ur: NEGATIVE mg/dL
Leukocytes,Ua: NEGATIVE
Nitrite: NEGATIVE
Protein, ur: NEGATIVE mg/dL
Specific Gravity, Urine: 1.01 (ref 1.005–1.030)
Urobilinogen, UA: 0.2 mg/dL (ref 0.0–1.0)
pH: 6 (ref 5.0–8.0)

## 2018-12-14 MED ORDER — KETOROLAC TROMETHAMINE 30 MG/ML IJ SOLN
INTRAMUSCULAR | Status: AC
  Filled 2018-12-14: qty 1

## 2018-12-14 MED ORDER — KETOROLAC TROMETHAMINE 30 MG/ML IJ SOLN
30.0000 mg | Freq: Once | INTRAMUSCULAR | Status: AC
  Administered 2018-12-14: 20:00:00 30 mg via INTRAMUSCULAR

## 2018-12-14 NOTE — Discharge Instructions (Signed)
Please try to strain the urine the next couple of days  Please try mobic for pain  Please follow up if the pain is ongoing  Please be seen more immediately if the pain becomes severe.

## 2018-12-14 NOTE — ED Provider Notes (Signed)
[place]    CSN: [id number] Arrival date & time: 12/14/18  1912      History   Chief Complaint Chief Complaint  Patient presents with  . Back Pain    HPI [name] is a 53 y.o. female. She is presenting with acute right lower back pain. This started earlier today. Pain is becoming more severe and progressing through the course of the day.  Experiencing some pain in the inguinal region as well.  She has had a kidney stone previously one other time.  This may feel similar to that.  Denies any blood in her urine.  Symptoms are worse with movement and prolonged sitting.  HPI  Past Medical History:  Diagnosis Date  . Abnormal Pap smear of cervix 1992   hx cryotherapy to cervix/also abn.pap 2004--Hyst  . Anemia    2015 to present  . Anxiety   . Arthritis    hands, legs  . Asthma   . Cavernous malformation    "indicative of brain lipoma"; is followed at [facility]  . Complication of anesthesia    hard to wake up post-op  . Endometriosis   . Fibroid   . GERD (gastroesophageal reflux disease)   . Headache(784.0)    migraines  . Hypothyroidism    Hx Rt.thyroid nodule--removed 1991 in [place]  . 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 Motion sickness    due to cavernous malformation  . Osteoporosis     Patient Active Problem List   Diagnosis Date Noted  . Thyroid nodule 06/18/2016  . Moderate persistent asthma 10/29/2014  . Rhinitis 10/29/2014  . GERD (gastroesophageal reflux disease) 10/29/2014  . Intraductal papilloma of breast 05/01/2012    Past Surgical History:  Procedure Laterality Date  . BREAST LUMPECTOMY WITH NEEDLE LOCALIZATION Left 05/27/2012   Procedure: BREAST LUMPECTOMY WITH NEEDLE LOCALIZATION;  Surgeon: Imogene Burn. Georgette Dover, MD;  Location: [facility];  Service: General;  Laterality: Left;  needle localization at breast center of [place]   . BREAST SURGERY  2014   Left lumpectomy--benign mass  . broken foot Left 02/2015  .  CESAREAN SECTION  1985  . CHOLECYSTECTOMY  1994  . EVALUATION UNDER ANESTHESIA WITH ANAL FISTULECTOMY  2011  . OOPHORECTOMY  2004   BSO--endometriosis  . THYROID LOBECTOMY  1990  . TOTAL ABDOMINAL HYSTERECTOMY  04/20/2002   with left uterolysis/BSO  . TUBAL LIGATION  1991    OB History    Gravida  1   Para  1   Term  1   Preterm      AB      Living  1     SAB      TAB      Ectopic      Multiple      Live Births  1            Home Medications    Prior to Admission medications   Medication Sig Start Date End Date Taking? Authorizing Provider  Albuterol Sulfate (PROAIR RESPICLICK) 123XX123 (90 Base) MCG/ACT AEPB Inhale 2 puffs into the lungs every 6 (six) hours as needed. 03/26/16   Kozlow, Donnamarie Poag, MD  doxycycline (DORYX) 100 MG EC tablet Take 100 mg by mouth daily.    [provider]  folic acid (FOLVITE) A999333 MCG tablet Take 400 mcg by mouth daily.    [provider]  Ginger, Zingiber officinalis, (GINGER ROOT) 250 MG CAPS Take 1 tablet  by mouth daily.    [provider]  L-Lysine 500 MG CAPS Take 1 capsule by mouth daily.    [provider]  levocetirizine (XYZAL) 5 MG tablet Take 5 mg by mouth at bedtime. 09/06/15   [provider]  levothyroxine (SYNTHROID, LEVOTHROID) 50 MCG tablet Take 1 tablet (50 mcg total) by mouth daily. Patient not taking: Reported on 07/31/2017 09/27/15   Nunzio Cobbs, MD  Multiple Vitamin (MULTIVITAMIN) tablet Take 1 tablet by mouth daily.    [provider]  naproxen sodium (ANAPROX) 220 MG tablet Take 220 mg by mouth as needed.     [provider]  omeprazole (PRILOSEC) 40 MG capsule TAKE 1 CAPSULE (40 MG TOTAL) BY MOUTH DAILY. 03/24/17   Kozlow, Donnamarie Poag, MD  PULMICORT FLEXHALER 180 MCG/ACT inhaler INHALE 2 PUFFS INTO THE LUNGS DAILY. 03/24/17   Kozlow, Donnamarie Poag, MD  ranitidine (ZANTAC) 150 MG tablet Take 2 tablets (300 mg total) by mouth at bedtime. Patient taking  differently: Take 300 mg by mouth as needed.  03/26/16   Kozlow, Donnamarie Poag, MD  sertraline (ZOLOFT) 100 MG tablet TAKE 1 TABLET BY MOUTH EVERY DAY 04/02/17   Yisroel Ramming, Brook E, MD  triamcinolone cream (KENALOG) 0.1 % APPLY 2-3 TIMES DAILY AS NEEDED FOR ITCHY AREAS FOR 2-3 WEEKS 03/08/16   [provider]  VIMOVO 500-20 MG TBEC Take 1 tablet by mouth as needed.  03/23/16   [provider]    Family History Family History  Problem Relation Age of Onset  . Cancer Mother        kidney  . Anesthesia problems Mother        post-op N/V  . Cancer Father        melanoma  . Hypertension Father   . Diabetes Maternal Grandmother   . Goiter Paternal Aunt     Social History Social History   Tobacco Use  . Smoking status: Former Smoker    Types: Cigarettes    Quit date: 11/18/2009    Years since quitting: 9.0  . Smokeless tobacco: Never Used  Substance Use Topics  . Alcohol use: No  . Drug use: No     Allergies   Sesame oil, Sulfa antibiotics, and Sulfamethoxazole   Review of Systems Review of Systems  Constitutional: Negative for fever.  HENT: Negative for congestion.   Respiratory: Negative for cough.   Cardiovascular: Negative for chest pain.  Gastrointestinal: Negative for abdominal pain.  Musculoskeletal: Positive for back pain.  Skin: Negative for color change.  Neurological: Negative for weakness.  Hematological: Negative for adenopathy.     Physical Exam Triage Vital Signs ED Triage Vitals  Enc Vitals Group     BP 12/14/18 1930 (!) 141/69     Pulse Rate 12/14/18 1930 75     Resp 12/14/18 1930 16     Temp 12/14/18 1930 98 F (36.7 C)     Temp Source 12/14/18 1930 Oral     SpO2 12/14/18 1930 99 %     Weight 12/14/18 1929 233 lb (105.7 kg)     Height --      Head Circumference --      Peak Flow --      Pain Score 12/14/18 1929 10     Pain Loc --      Pain Edu? --      Excl. in [place]? --    No data found.  Updated Vital Signs BP Marland Kitchen)  141/69  (BP Location: Right Arm)   Pulse 75   Temp 98 F (36.7 C) (Oral)   Resp 16   Wt 105.7 kg   SpO2 99%   BMI 41.94 kg/m   Visual Acuity Right Eye Distance:   Left Eye Distance:   Bilateral Distance:    Right Eye Near:   Left Eye Near:    Bilateral Near:     Physical Exam Gen: NAD, alert, cooperative with exam, well-appearing ENT: normal lips, normal nasal mucosa,  Eye: normal EOM, normal conjunctiva and lids CV:  no edema, +2 pedal pulses   Resp: no accessory muscle use, non-labored,   Skin: no rashes, no areas of induration  Neuro: normal tone, normal sensation to touch Psych:  normal insight, alert and oriented MSK:  Back:  No TTP of the midline lumbar spine  TTP of the right lower paraspinal muscle  Limited flexion and extension  Normal IR and Er on the right  Negative SLR NVI     UC Treatments / Results  Labs (all labs ordered are listed, but only abnormal results are displayed) Labs Reviewed  POCT URINALYSIS DIP (DEVICE)    EKG   Radiology No results found.  Procedures Procedures (including critical care time)  Medications Ordered in UC Medications  ketorolac (TORADOL) 30 MG/ML injection 30 mg (30 mg Intramuscular Given 12/14/18 2012)  ketorolac (TORADOL) 30 MG/ML injection (has no administration in time range)    Initial Impression / Assessment and Plan / UC Course  I have reviewed the triage vital signs and the nursing notes.  Pertinent labs & imaging results that were available during my care of the patient were reviewed by me and considered in my medical decision making (see chart for details).     Ms. Hebbard is a 54 year old female that is presenting with right lower back pain.  Urinalysis did not show any blood but still concern for nephrolithiasis.  Provided with intramuscular Toradol injection.  Counseled to filter the urine.  Given indications to follow-up.  Provided with indications to seek more immediate care.  Final Clinical  Impressions(s) / UC Diagnoses   Final diagnoses:  Acute right-sided low back pain without sciatica     Discharge Instructions     Please try to strain the urine the next couple of days  Please try mobic for pain  Please follow up if the pain is ongoing  Please be seen more immediately if the pain becomes severe.     ED Prescriptions    None     PDMP not reviewed this encounter.   Rosemarie Ax, MD 12/14/18 2151

## 2018-12-14 NOTE — ED Triage Notes (Signed)
Pt states she has voided 2 time today. Pt states she has lower back pain. This started today.

## 2019-02-03 ENCOUNTER — Telehealth: Payer: Self-pay | Admitting: Hematology

## 2019-02-03 NOTE — Progress Notes (Signed)
HEMATOLOGY/ONCOLOGY CONSULTATION NOTE  Date of Service: 02/04/2019  Patient Care Team: Orpah Melter, MD as PCP - General (Family Medicine)  REFERRING PHYSICIAN: Orpah Melter, MD  CHIEF COMPLAINTS/PURPOSE OF CONSULTATION:  Acute myeloid leukemia (AML)   HISTORY OF PRESENTING ILLNESS:   [name] is a wonderful 53 y.o. female who has been referred to [place] by Orpah Melter, MD for evaluation and management of suspected acute leukemia. She is accompanied today by her husband. The pt reports that she is doing well overall.   The pt reports that since her fall in August 2020 she has began noticing symptoms. She began having fevers every evening around the same time in the evening. The fevers would be anywhere from 101-101.9 degrees.  In November she had a Covid and flu test that were both negative. No other work up was done at that time.   She went to see her Dr. Bjorn Loser for the night sweats   She has osteoporosis She as broken one foot in 2017 and the other in 2019.   She had a fully hysterectomy in her 46's. It was originally thought to be ovarian cancer and but it was endometriosis   She has chills and night sweats that has been about the same for the past 3 months.  She currently feels like her throat or neck is swollen on the rt side and is uncomfortable when she swallows.  She has been on Zoloft for 3 years.   She was advised that she had anemia by lab work done at her place of work. She is taking iron supplements for a few months with resulting constipation.  She has lost 11lbs in the last few months and has been noticing more bruising. She has also being hurting all over.   She has had two mouth sores in the past two weeks (both on the left side) .   She has a difficult time falling asleep. She has sleep apnea and is on a CPap machine.  She takes ginger root as needed.  She takes tylenol as needed. Xyzal at night.  She take Prilosec but not zantac   She  isn't taking a folic acid or multivitamin.  She is no longer on the thyroid medication. She hasn't taken in two years.   She usually stays very active with her 9 grandchildren, but her recent fatigue has caused interference.   Contact information: [name] (husband) [phone] or [name] (cell) [phone]  Most recent lab results (02/01/2019) of CBC is as follows: all values are WNL except for WBC at 20.6, HGB at 10.7, PLT at 81, NEUT at 0.0, Glucose at 113 .  On review of systems, pt reports fatigue, night sweats, fevers, chills, constipation (from iron supplements), mouth sores, tenderness on left side of neck, headaches in center of forehead, rashes and denies some abdominal pain with medication, lymph node swelling, pedal edema  and any other symptoms.   On Family Hx the pt reports there are no blood disorder that run in her family. Grandfather and ALS. Father had melanoma. Mother has fibromyalgia. Grandmother had diabetes.   MEDICAL HISTORY:  Past Medical History:  Diagnosis Date  . Abnormal Pap smear of cervix 1992   hx cryotherapy to cervix/also abn.pap 2004--Hyst  . Anemia    2015 to present  . Anxiety   . Arthritis    hands, legs  . Asthma   . Cavernous malformation    "indicative of brain lipoma"; is [facility] at [facility]  .  Complication of anesthesia    hard to wake up post-op  . Endometriosis   . Fibroid   . GERD (gastroesophageal reflux disease)   . Headache(784.0)    migraines  . Hypothyroidism    Hx Rt.thyroid nodule--removed 1991 in [place]  . 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 Motion sickness    due to cavernous malformation  . Osteoporosis      SURGICAL HISTORY: Past Surgical History:  Procedure Laterality Date  . BREAST LUMPECTOMY WITH NEEDLE LOCALIZATION Left 05/27/2012   Procedure: BREAST LUMPECTOMY WITH NEEDLE LOCALIZATION;  Surgeon: Imogene Burn. Georgette Dover, MD;  Location: [facility];  Service: General;  Laterality: Left;  needle  localization at breast center of [place]   . BREAST SURGERY  2014   Left lumpectomy--benign mass  . broken foot Left 02/2015  . CESAREAN SECTION  1985  . CHOLECYSTECTOMY  1994  . EVALUATION UNDER ANESTHESIA WITH ANAL FISTULECTOMY  2011  . OOPHORECTOMY  2004   BSO--endometriosis  . THYROID LOBECTOMY  1990  . TOTAL ABDOMINAL HYSTERECTOMY  04/20/2002   with left uterolysis/BSO  . TUBAL LIGATION  1991     SOCIAL HISTORY: Social History   Socioeconomic History  . Marital status: Married    Spouse name: Not on file  . Number of children: Not on file  . Years of education: Not on file  . Highest education level: Not on file  Occupational History  . Not on file  Tobacco Use  . Smoking status: Former Smoker    Types: Cigarettes    Quit date: 11/18/2009    Years since quitting: 9.2  . Smokeless tobacco: Never Used  Substance and Sexual Activity  . Alcohol use: No  . Drug use: No  . Sexual activity: Yes    Birth control/protection: Surgical    Comment: Tubal/TAH/BSO  Other Topics Concern  . Not on file  Social History Narrative  . Not on file   Social Determinants of Health   Financial Resource Strain:   . Difficulty of Paying Living Expenses: Not on file  Food Insecurity:   . Worried About Charity fundraiser in the Last Year: Not on file  . Ran Out of Food in the Last Year: Not on file  Transportation Needs:   . Lack of Transportation (Medical): Not on file  . Lack of Transportation (Non-Medical): Not on file  Physical Activity:   . Days of Exercise per Week: Not on file  . Minutes of Exercise per Session: Not on file  Stress:   . Feeling of Stress : Not on file  Social Connections:   . Frequency of Communication with Friends and Family: Not on file  . Frequency of Social Gatherings with Friends and Family: Not on file  . Attends Religious Services: Not on file  . Active Member of Clubs or Organizations: Not on file  . Attends Archivist Meetings: Not on file   . Marital Status: Not on file  Intimate Partner Violence:   . Fear of Current or Ex-Partner: Not on file  . Emotionally Abused: Not on file  . Physically Abused: Not on file  . Sexually Abused: Not on file     FAMILY HISTORY: Family History  Problem Relation Age of Onset  . Cancer Mother        kidney  . Anesthesia problems Mother        post-op N/V  . Cancer Father  melanoma  . Hypertension Father   . Diabetes Maternal Grandmother   . Goiter Paternal Aunt      ALLERGIES:   is allergic to sesame oil; sulfa antibiotics; and sulfamethoxazole.   MEDICATIONS:  Current Outpatient Medications  Medication Sig Dispense Refill  . Albuterol Sulfate (PROAIR RESPICLICK) 123XX123 (90 Base) MCG/ACT AEPB Inhale 2 puffs into the lungs every 6 (six) hours as needed. 1 each 3  . doxycycline (DORYX) 100 MG EC tablet Take 100 mg by mouth daily.    . folic acid (FOLVITE) A999333 MCG tablet Take 400 mcg by mouth daily.    . Ginger, Zingiber officinalis, (GINGER ROOT) 250 MG CAPS Take 1 tablet by mouth daily.    Marland Kitchen L-Lysine 500 MG CAPS Take 1 capsule by mouth daily.    Marland Kitchen levocetirizine (XYZAL) 5 MG tablet Take 5 mg by mouth at bedtime.  5  . levothyroxine (SYNTHROID, LEVOTHROID) 50 MCG tablet Take 1 tablet (50 mcg total) by mouth daily. (Patient not taking: Reported on 07/31/2017) 90 tablet 3  . Multiple Vitamin (MULTIVITAMIN) tablet Take 1 tablet by mouth daily.    . naproxen sodium (ANAPROX) 220 MG tablet Take 220 mg by mouth as needed.     Marland Kitchen omeprazole (PRILOSEC) 40 MG capsule TAKE 1 CAPSULE (40 MG TOTAL) BY MOUTH DAILY. 30 capsule 0  . PULMICORT FLEXHALER 180 MCG/ACT inhaler INHALE 2 PUFFS INTO THE LUNGS DAILY. 1 each 0  . ranitidine (ZANTAC) 150 MG tablet Take 2 tablets (300 mg total) by mouth at bedtime. (Patient taking differently: Take 300 mg by mouth as needed. ) 60 tablet 11  . sertraline (ZOLOFT) 100 MG tablet TAKE 1 TABLET BY MOUTH EVERY DAY 30 tablet 0  . triamcinolone cream (KENALOG)  0.1 % APPLY 2-3 TIMES DAILY AS NEEDED FOR ITCHY AREAS FOR 2-3 WEEKS  1  . VIMOVO 500-20 MG TBEC Take 1 tablet by mouth as needed.   2   No current facility-administered medications for this visit.     REVIEW OF SYSTEMS:   A 10+ POINT REVIEW OF SYSTEMS WAS OBTAINED including neurology, dermatology, psychiatry, cardiac, respiratory, lymph, extremities, GI, GU, Musculoskeletal, constitutional, breasts, reproductive, HEENT.  All pertinent positives are noted in the HPI.  All others are negative.    PHYSICAL EXAMINATION: ECOG PERFORMANCE STATUS: 1 - Symptomatic but completely ambulatory  Vitals:   02/04/19 1152  BP: (!) 153/73  Pulse: 87  Resp: 18  Temp: 98.2 F (36.8 C)  SpO2: 99%   Filed Weights   02/04/19 1152  Weight: 225 lb 9.6 oz (102.3 kg)   Body mass index is 40.61 kg/m.  GENERAL:alert, in no acute distress and comfortable SKIN: no acute rashes, no significant lesions EYES: conjunctiva are pink and non-injected, sclera anicteric OROPHARYNX: MMM, no exudates, no oropharyngeal erythema or ulceration NECK: supple, no JVD LYMPH:  no palpable lymphadenopathy in the cervical, axillary or inguinal regions LUNGS: clear to auscultation b/l with normal respiratory effort HEART: regular rate & rhythm ABDOMEN:  normoactive bowel sounds , non tender, not distended. Extremity: no pedal edema PSYCH: alert & oriented x 3 with fluent speech NEURO: no focal motor/sensory deficits   LABORATORY DATA:  I have reviewed the data as listed  CBC Latest Ref Rng & Units 02/04/2019 02/04/2019 12/15/2017  WBC 4.0 - 10.5 K/uL 20.2(H) - 10.0  Hemoglobin 12.0 - 15.0 g/dL 11.2(L) - 11.4(L)  Hematocrit 36.0 - 46.0 % 35.9(L) - 36.2  Platelets 150 - 400 K/uL 82(L) 79(L) 306  CMP Latest Ref Rng & Units 02/04/2019 12/15/2017  Glucose 70 - 99 mg/dL 105(H) 115(H)  BUN 6 - 20 mg/dL 12 14  Creatinine 0.44 - 1.00 mg/dL 0.94 0.85  Sodium 135 - 145 mmol/L 141 141  Potassium 3.5 - 5.1 mmol/L 4.4  4.1  Chloride 98 - 111 mmol/L 104 110  CO2 22 - 32 mmol/L 26 23  Calcium 8.9 - 10.3 mg/dL 9.3 9.3  Total Protein 6.5 - 8.1 g/dL 7.8 -  Total Bilirubin 0.3 - 1.2 mg/dL 0.5 -  Alkaline Phos 38 - 126 U/L 116 -  AST 15 - 41 U/L 14(L) -  ALT 0 - 44 U/L 10 -   Component     Latest Ref Rng & Units 02/04/2019  Prothrombin Time     11.4 - 15.2 seconds 12.8  INR     0.8 - 1.2 1.0  APTT     24 - 36 seconds 31  Fibrinogen     210 - 475 mg/dL 639 (H)  D-Dimer, Quant     0.00 - 0.50 ug/mL-FEU 0.63 (H)  Platelets     150 - 400 K/uL 79 (L)  Smear Review      NO SCHISTOCYTES SEEN  Uric Acid, Serum     2.5 - 7.1 mg/dL 7.0  LDH     98 - 192 U/L 231 (H)    RADIOGRAPHIC STUDIES: I have personally reviewed the radiological images as listed and agreed with the findings in the report. No results found.   ASSESSMENT & PLAN:  KALIANNAH CALVA is a 53 y.o. female with:  1) Newly diagnosed Acute Myeloid Leukemia. 2) Severe neutropenia related to AML 3) Thrombocytopenia and Anemia - related to AML 4) Rt neck pain and swelling with fever -- concern for parapharyngeal or retropharyngeal abscess 5) Mouth ulcers - ?aphthous ulcers from neutropenia vs HSV 6) Skin lesions ? Folliculitis vs sweet syndrome  PLAN: -Discussed patient's most recent labs from 12/14/20202, all values are WNL except for WBC at 20.6, HGB at 10.7, PLT at 81, NEUT at 0.0, Glucose at 113 . -Discussed 02/01/2019 platelets at 81 -Discussed 02/01/2019 hemoglobin at 10.7 -Discussed 02/01/2019 WBC 20.6 -Discussed 02/01/2019 Neutrophil at 0.0, Peripheral blood smear personally reviewed by me--- normocytic RBCs, decreased platelets with on platelet clumping noted, severely reduced neutrophils, extensive presence of blasts , some with Auer rods. -discussed with patient concern for Acute myeloid leukemia -Advised that body/bone pain lkely related to AML -Discussed the swelling in her right side of her neck: early development of abscess  in the back of the pharynx - will need CT neck to evaluate this. -Discussed broadly, treatment treatment consideration and recommended admission to inpatient leukemia service. -Discussed that we will get labs and start referral to [facility] service. -I discussed patients case with Dr Lissa Merlin at [facility] - acute leukemia service and patient was accepted for admission to the inpatient acute leukemia service. -I contact patient/her husband [name] to inform them of lab results and confirmation of AML with prelim flow cytometry results. -patient was at registration for admission to inpatient leukemia service. -continued mx of AML per leukemia service at [facility] today  All of the patients questions were answered with apparent satisfaction. The patient knows to call the clinic with any problems, questions or concerns.  I spent 50 minutes counseling the patient face to face. The total time spent in the appointment was 80 minutes and more than  50% was on counseling and direct patient cares and co-oridination of care with [facility].    Sullivan Lone MD MS AAHIVMS [facility] Hematology/Oncology Physician [facility]  (Office):       [phone] (Work cell):  [phone] (Fax):           [phone]  02/03/2019 5:42 PM  I, Scot Dock, am acting as a scribe for Dr. Sullivan Lone.   .I have reviewed the above documentation for accuracy and completeness, and I agree with the above. Brunetta Genera MD

## 2019-02-03 NOTE — Telephone Encounter (Signed)
Received a call from [place] [facility] to schedule a new patient appt for [name] for AML. Pt has been scheduled to see Dr. [facility] on 12/17 at [place] will notify the pt of the appt date and time.

## 2019-02-04 ENCOUNTER — Inpatient Hospital Stay: Payer: Commercial Managed Care - PPO | Attending: Hematology | Admitting: Hematology

## 2019-02-04 ENCOUNTER — Telehealth: Payer: Self-pay | Admitting: *Deleted

## 2019-02-04 ENCOUNTER — Other Ambulatory Visit: Payer: Self-pay

## 2019-02-04 ENCOUNTER — Telehealth: Payer: Self-pay | Admitting: Hematology

## 2019-02-04 ENCOUNTER — Inpatient Hospital Stay: Payer: Commercial Managed Care - PPO

## 2019-02-04 VITALS — BP 153/73 | HR 87 | Temp 98.2°F | Resp 18 | Ht 62.5 in | Wt 225.6 lb

## 2019-02-04 DIAGNOSIS — G4730 Sleep apnea, unspecified: Secondary | ICD-10-CM | POA: Diagnosis not present

## 2019-02-04 DIAGNOSIS — Z90722 Acquired absence of ovaries, bilateral: Secondary | ICD-10-CM | POA: Diagnosis not present

## 2019-02-04 DIAGNOSIS — C9200 Acute myeloblastic leukemia, not having achieved remission: Secondary | ICD-10-CM

## 2019-02-04 DIAGNOSIS — K5900 Constipation, unspecified: Secondary | ICD-10-CM | POA: Diagnosis not present

## 2019-02-04 DIAGNOSIS — Z808 Family history of malignant neoplasm of other organs or systems: Secondary | ICD-10-CM | POA: Diagnosis not present

## 2019-02-04 DIAGNOSIS — F419 Anxiety disorder, unspecified: Secondary | ICD-10-CM | POA: Insufficient documentation

## 2019-02-04 DIAGNOSIS — M1990 Unspecified osteoarthritis, unspecified site: Secondary | ICD-10-CM | POA: Insufficient documentation

## 2019-02-04 DIAGNOSIS — M542 Cervicalgia: Secondary | ICD-10-CM | POA: Insufficient documentation

## 2019-02-04 DIAGNOSIS — M810 Age-related osteoporosis without current pathological fracture: Secondary | ICD-10-CM | POA: Diagnosis not present

## 2019-02-04 DIAGNOSIS — R61 Generalized hyperhidrosis: Secondary | ICD-10-CM | POA: Diagnosis not present

## 2019-02-04 DIAGNOSIS — Z833 Family history of diabetes mellitus: Secondary | ICD-10-CM | POA: Diagnosis not present

## 2019-02-04 DIAGNOSIS — Z79899 Other long term (current) drug therapy: Secondary | ICD-10-CM | POA: Insufficient documentation

## 2019-02-04 DIAGNOSIS — Z8051 Family history of malignant neoplasm of kidney: Secondary | ICD-10-CM | POA: Diagnosis not present

## 2019-02-04 DIAGNOSIS — Z87891 Personal history of nicotine dependence: Secondary | ICD-10-CM | POA: Diagnosis not present

## 2019-02-04 DIAGNOSIS — D696 Thrombocytopenia, unspecified: Secondary | ICD-10-CM | POA: Insufficient documentation

## 2019-02-04 DIAGNOSIS — Z8249 Family history of ischemic heart disease and other diseases of the circulatory system: Secondary | ICD-10-CM | POA: Insufficient documentation

## 2019-02-04 DIAGNOSIS — R509 Fever, unspecified: Secondary | ICD-10-CM | POA: Diagnosis not present

## 2019-02-04 DIAGNOSIS — R5383 Other fatigue: Secondary | ICD-10-CM | POA: Insufficient documentation

## 2019-02-04 DIAGNOSIS — Z882 Allergy status to sulfonamides status: Secondary | ICD-10-CM | POA: Diagnosis not present

## 2019-02-04 DIAGNOSIS — R21 Rash and other nonspecific skin eruption: Secondary | ICD-10-CM | POA: Insufficient documentation

## 2019-02-04 LAB — CBC WITH DIFFERENTIAL/PLATELET
Abs Immature Granulocytes: 0 10*3/uL (ref 0.00–0.07)
Basophils Absolute: 0 10*3/uL (ref 0.0–0.1)
Basophils Relative: 0 %
Blasts: 88 %
Eosinophils Absolute: 0 10*3/uL (ref 0.0–0.5)
Eosinophils Relative: 0 %
HCT: 35.9 % — ABNORMAL LOW (ref 36.0–46.0)
Hemoglobin: 11.2 g/dL — ABNORMAL LOW (ref 12.0–15.0)
Lymphocytes Relative: 11 %
Lymphs Abs: 2.2 10*3/uL (ref 0.7–4.0)
MCH: 27.7 pg (ref 26.0–34.0)
MCHC: 31.2 g/dL (ref 30.0–36.0)
MCV: 88.9 fL (ref 80.0–100.0)
Monocytes Absolute: 0 10*3/uL — ABNORMAL LOW (ref 0.1–1.0)
Monocytes Relative: 0 %
Neutro Abs: 0.2 10*3/uL — CL (ref 1.7–7.7)
Neutrophils Relative %: 1 %
Platelets: 82 10*3/uL — ABNORMAL LOW (ref 150–400)
RBC: 4.04 MIL/uL (ref 3.87–5.11)
RDW: 15.6 % — ABNORMAL HIGH (ref 11.5–15.5)
WBC: 20.2 10*3/uL — ABNORMAL HIGH (ref 4.0–10.5)
nRBC: 0.3 % — ABNORMAL HIGH (ref 0.0–0.2)

## 2019-02-04 LAB — LACTATE DEHYDROGENASE: LDH: 231 U/L — ABNORMAL HIGH (ref 98–192)

## 2019-02-04 LAB — CMP (CANCER CENTER ONLY)
ALT: 10 U/L (ref 0–44)
AST: 14 U/L — ABNORMAL LOW (ref 15–41)
Albumin: 4.4 g/dL (ref 3.5–5.0)
Alkaline Phosphatase: 116 U/L (ref 38–126)
Anion gap: 11 (ref 5–15)
BUN: 12 mg/dL (ref 6–20)
CO2: 26 mmol/L (ref 22–32)
Calcium: 9.3 mg/dL (ref 8.9–10.3)
Chloride: 104 mmol/L (ref 98–111)
Creatinine: 0.94 mg/dL (ref 0.44–1.00)
GFR, Est AFR Am: >60 mL/min (ref >60)
GFR, Estimated: >60 mL/min (ref >60)
Glucose, Bld: 105 mg/dL — ABNORMAL HIGH (ref 70–99)
Potassium: 4.4 mmol/L (ref 3.5–5.1)
Sodium: 141 mmol/L (ref 135–145)
Total Bilirubin: 0.5 mg/dL (ref 0.3–1.2)
Total Protein: 7.8 g/dL (ref 6.5–8.1)

## 2019-02-04 LAB — DIC (DISSEMINATED INTRAVASCULAR COAGULATION)PANEL
D-Dimer, Quant: 0.63 ug/mL-FEU — ABNORMAL HIGH (ref 0.00–0.50)
Fibrinogen: 639 mg/dL — ABNORMAL HIGH (ref 210–475)
INR: 1 (ref 0.8–1.2)
Platelets: 79 10*3/uL — ABNORMAL LOW (ref 150–400)
Prothrombin Time: 12.8 seconds (ref 11.4–15.2)
Smear Review: NONE SEEN

## 2019-02-04 LAB — DIC (DISSEMINATED INTRAVASCULAR COAGULATION) PANEL: aPTT: 31 seconds (ref 24–36)

## 2019-02-04 LAB — URIC ACID: Uric Acid, Serum: 7 mg/dL (ref 2.5–7.1)

## 2019-02-04 LAB — PATHOLOGIST SMEAR REVIEW

## 2019-02-04 NOTE — Telephone Encounter (Signed)
Correction, patient has been scheduled to see Dr. Irene Limbo, not Dr. Alen Blew.

## 2019-02-04 NOTE — Patient Instructions (Signed)
Thank you for choosing [facility] to provide your oncology and hematology care.   Should you have questions after your visit to the [facility] ([facility]), please contact this office at [phone] between 8:30 AM and 4:30 PM.  Voice mails left after 4:00 PM may not be returned until the following business day.  Calls received after 4:30 PM will be answered by an off-site Nurse Triage Line.    Prescription Refills:  Please have your pharmacy contact us directly for most prescription requests.  Contact the office directly for refills of narcotics (pain medications). Allow 48-72 hours for refills.  Appointments: Please contact the [facility] scheduling department [phone] for questions regarding [facility] appointment scheduling.  Contact the schedulers with any scheduling changes so that your appointment can be rescheduled in a timely manner.   Central Scheduling for [facility] [phone] - Call to schedule procedures such as PET scans, CT scans, MRI, Ultrasound, etc.  To afford each patient quality time with our providers, please arrive 30 minutes before your scheduled appointment time.  If you arrive late for your appointment, you may be asked to reschedule.  We strive to give you quality time with our providers, and arriving late affects you and other patients whose appointments are after yours. If you are a no show for multiple scheduled visits, you may be dismissed from the clinic at the providers discretion.     Resources: [facility] Social Workers [phone] for additional information on assistance programs --Anne Cunningham/Abigail Elmore  [place] DSS  [phone]: Information regarding food stamps, Medicaid, and utility assistance [place] 336-333-6589   [place] [employer]'s shared-ride transportation service for eligible riders who have a disability that prevents them from riding the fixed route bus.   Medicare Rights Center 800-333-4114 Helps people with  Medicare understand their rights and benefits, navigate the Medicare system, and secure the quality healthcare they deserve [employer] 800-227-2345 Assists patients locate various types of support and financial assistance Cancer Care: 1-800-813-HOPE (4673) Provides financial assistance, online support groups, medication/co-pay assistance.   Transportation Assistance for appointments at [facility]: Transportation Coordinator [phone]  Again, thank you for choosing [facility] for your care.       

## 2019-02-04 NOTE — Telephone Encounter (Signed)
At Dr. Grier Mitts request, contacted [facility] [place] [phone] for immediate referral to [facility]. Per [place] - transfer nurse - no oncology beds. Gave Dr. Irene Limbo  # to [place] - he will give to Dr. Lissa Merlin, on call MD for leukemia services. Dr.Ellis will contact Dr. Irene Limbo. Dr. Irene Limbo spoke with Dr. Lissa Merlin and provided clinical information about patient. Patient to be a direct admit to [facility]. They will contact patient. Contacted patient to let her know [facility] will be contacting her regarding admission today. She verbalized understanding.

## 2019-02-04 NOTE — Telephone Encounter (Signed)
Per 12/17 [facility] today.  Lab appt was already scheduled.

## 2019-02-05 ENCOUNTER — Telehealth: Payer: Self-pay | Admitting: *Deleted

## 2019-02-05 LAB — SURGICAL PATHOLOGY

## 2019-02-05 NOTE — Telephone Encounter (Signed)
Referral faxed to [facility] - release [id number]

## 2019-02-10 LAB — FLOW CYTOMETRY

## 2019-03-22 DEATH — deceased

## 2019-11-10 IMAGING — CR DG CHEST 2V
1 series · 2 of 2 positions shown · non-contrast
Comparison: Radiographs October 31, 2016.

CLINICAL DATA: Shortness of breath.

EXAM:
CHEST - 2 VIEW

[Series 1: dg chest 2 view · 0.14mm/px · 2 of 2 slices shown]
[im 1/2]
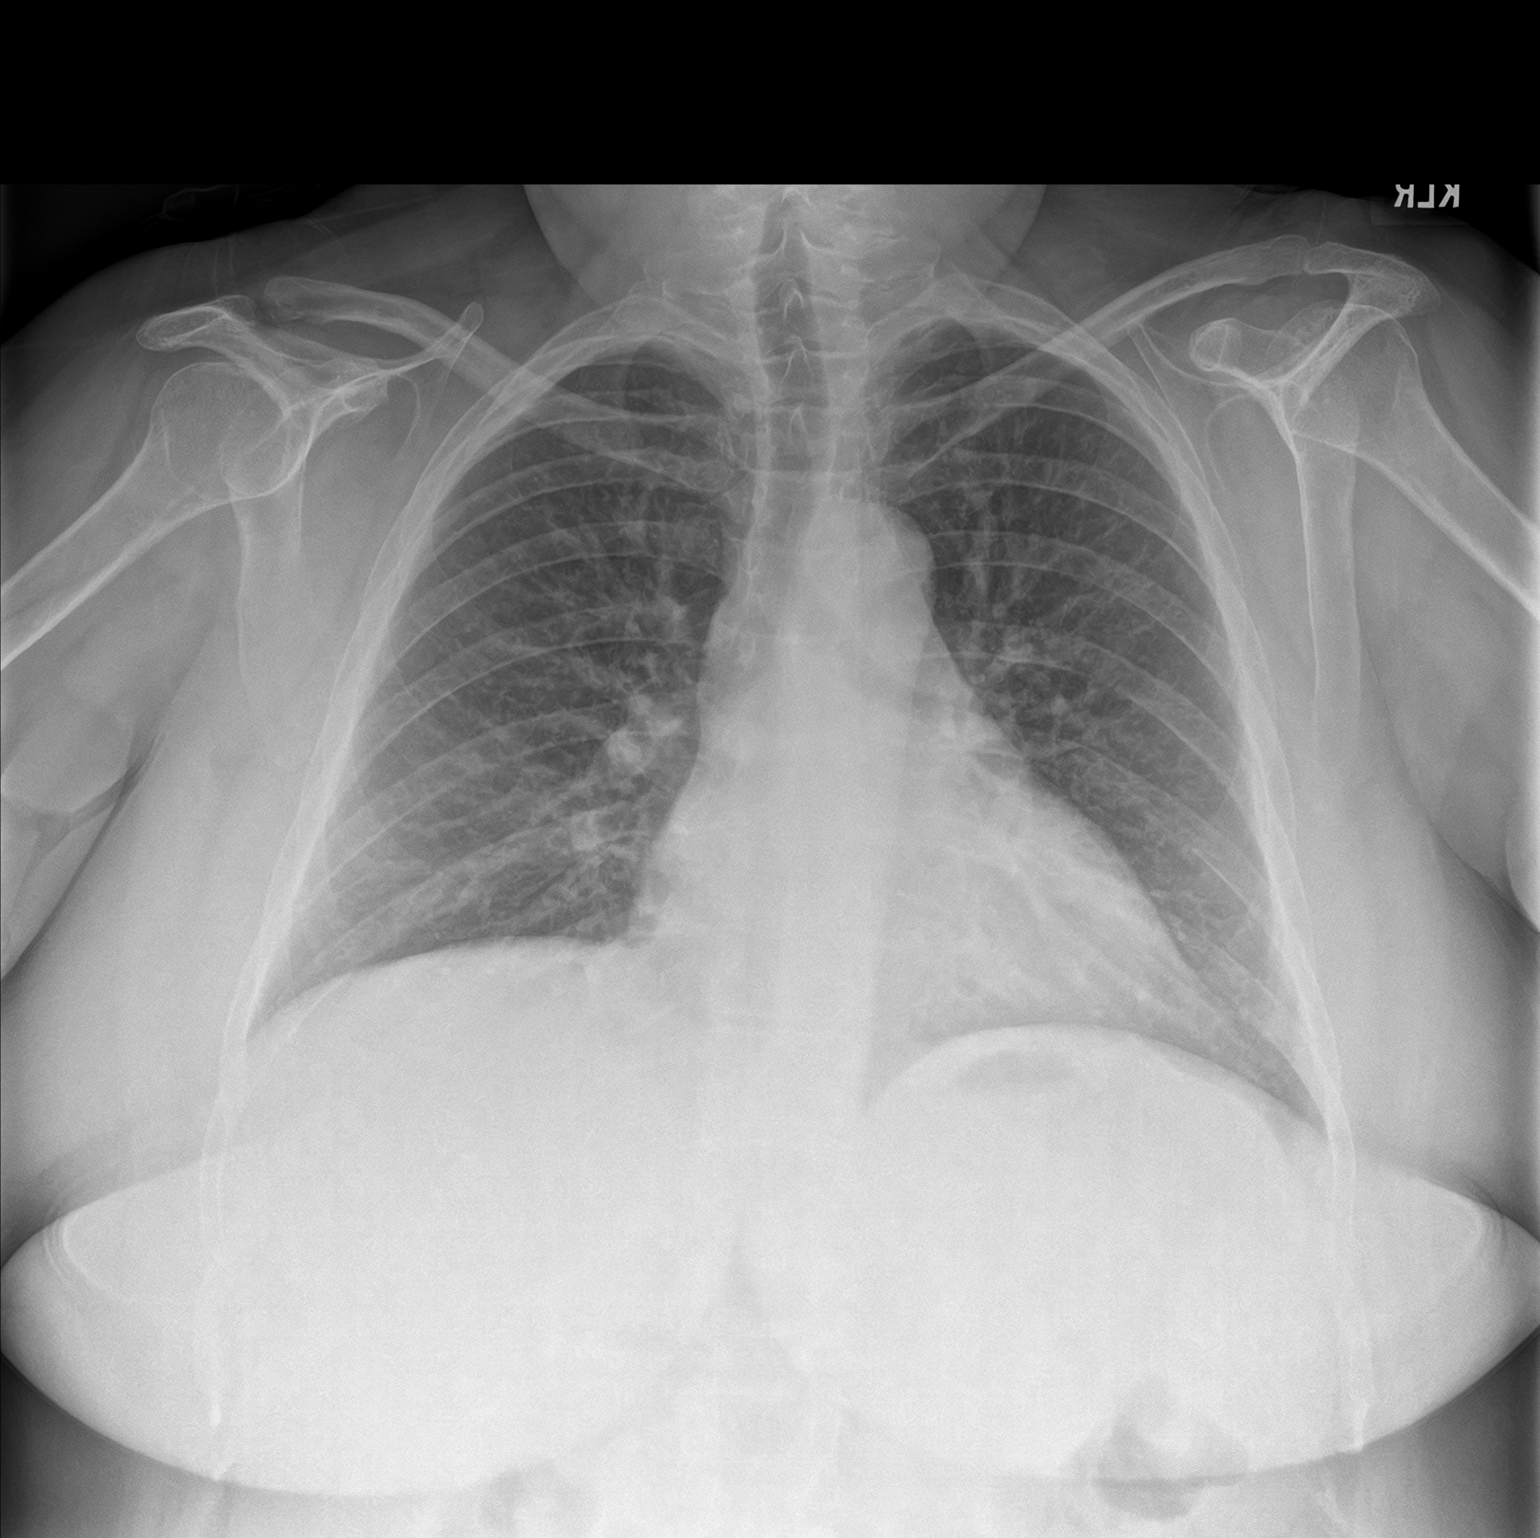
[im 2/2]
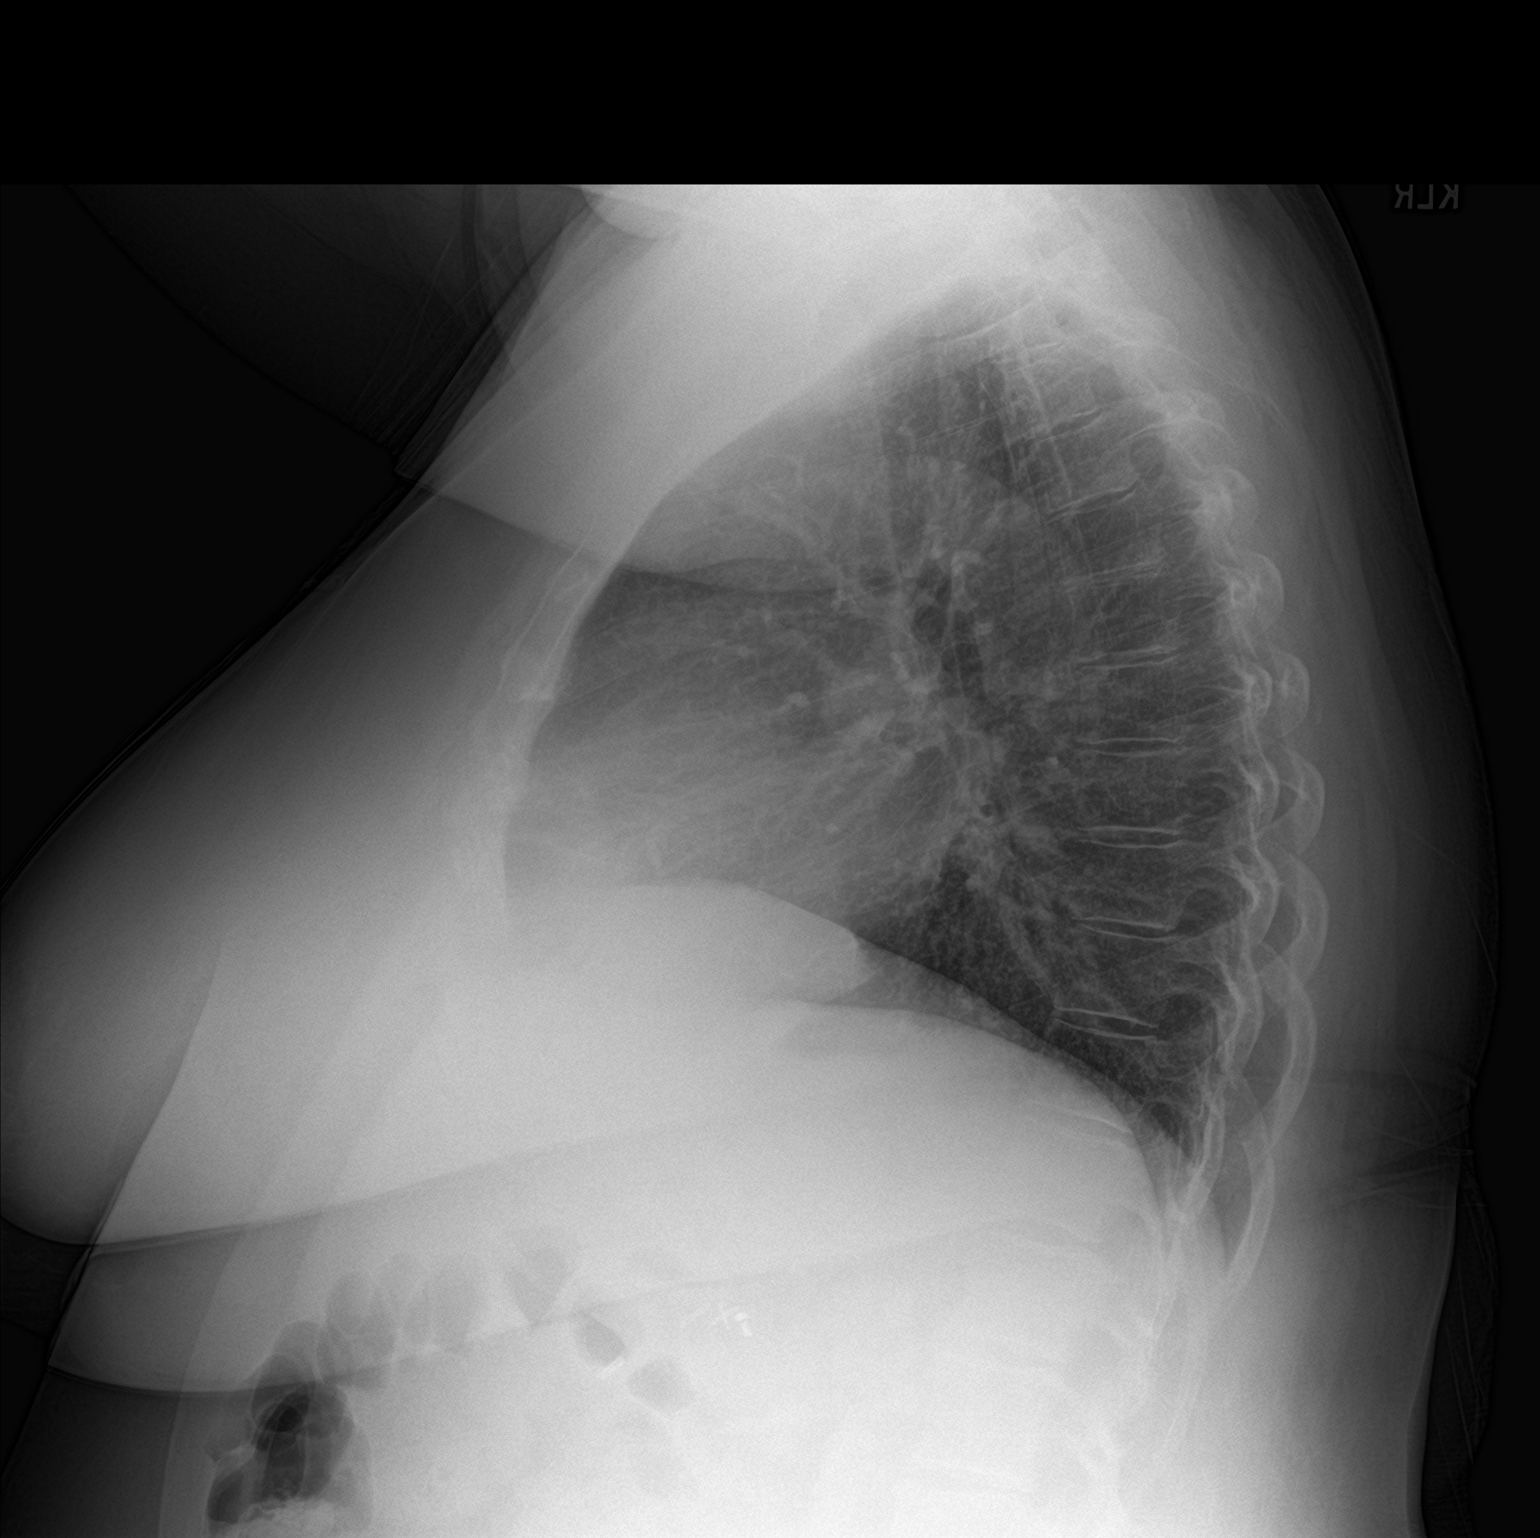

[2 of 2 positions shown; findings below may reference images not displayed]

FINDINGS: The heart size and mediastinal contours are within normal limits.
Both lungs are clear. No pneumothorax or pleural effusion is noted.
The visualized skeletal structures are unremarkable.
IMPRESSION: No active cardiopulmonary disease.
